# Patient Record
Sex: Male | Born: 1955 | Race: White | Hispanic: No | Marital: Married | Smoking: Never smoker
Health system: Southern US, Community
[De-identification: ages and names within clinical notes are randomized; demographics above are authoritative.]

## PROBLEM LIST (undated history)

## (undated) DIAGNOSIS — E039 Hypothyroidism, unspecified: Secondary | ICD-10-CM

## (undated) DIAGNOSIS — K219 Gastro-esophageal reflux disease without esophagitis: Secondary | ICD-10-CM

## (undated) DIAGNOSIS — F4321 Adjustment disorder with depressed mood: Secondary | ICD-10-CM

## (undated) DIAGNOSIS — J189 Pneumonia, unspecified organism: Secondary | ICD-10-CM

## (undated) DIAGNOSIS — E079 Disorder of thyroid, unspecified: Secondary | ICD-10-CM

## (undated) DIAGNOSIS — J45990 Exercise induced bronchospasm: Secondary | ICD-10-CM

## (undated) DIAGNOSIS — S0990XA Unspecified injury of head, initial encounter: Secondary | ICD-10-CM

## (undated) DIAGNOSIS — E7800 Pure hypercholesterolemia, unspecified: Secondary | ICD-10-CM

## (undated) HISTORY — PX: SHOULDER ARTHROSCOPY: SHX128

## (undated) HISTORY — PX: OTHER SURGICAL HISTORY: SHX169

---
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
## 1998-02-17 ENCOUNTER — Ambulatory Visit (HOSPITAL_COMMUNITY): Admission: RE | Admit: 1998-02-17 | Discharge: 1998-02-17 | Payer: Self-pay | Admitting: Gastroenterology

## 2004-11-07 ENCOUNTER — Ambulatory Visit (HOSPITAL_COMMUNITY): Admission: RE | Admit: 2004-11-07 | Discharge: 2004-11-07 | Payer: Self-pay | Admitting: *Deleted

## 2005-01-10 ENCOUNTER — Ambulatory Visit (HOSPITAL_COMMUNITY): Admission: RE | Admit: 2005-01-10 | Discharge: 2005-01-10 | Payer: Self-pay | Admitting: Orthopedic Surgery

## 2005-09-06 ENCOUNTER — Encounter: Admission: RE | Admit: 2005-09-06 | Discharge: 2005-09-06 | Payer: Self-pay | Admitting: Family Medicine

## 2005-10-31 ENCOUNTER — Ambulatory Visit (HOSPITAL_COMMUNITY): Admission: RE | Admit: 2005-10-31 | Discharge: 2005-10-31 | Payer: Self-pay | Admitting: Neurology

## 2005-12-29 ENCOUNTER — Ambulatory Visit (HOSPITAL_COMMUNITY): Admission: RE | Admit: 2005-12-29 | Discharge: 2005-12-30 | Payer: Self-pay | Admitting: Otolaryngology

## 2005-12-29 ENCOUNTER — Encounter (INDEPENDENT_AMBULATORY_CARE_PROVIDER_SITE_OTHER): Payer: Self-pay | Admitting: *Deleted

## 2006-08-30 ENCOUNTER — Encounter: Admission: RE | Admit: 2006-08-30 | Discharge: 2006-08-30 | Payer: Self-pay | Admitting: Neurology

## 2007-08-08 DIAGNOSIS — F4321 Adjustment disorder with depressed mood: Secondary | ICD-10-CM

## 2007-08-08 HISTORY — DX: Adjustment disorder with depressed mood: F43.21

## 2008-04-01 ENCOUNTER — Encounter: Admission: RE | Admit: 2008-04-01 | Discharge: 2008-04-01 | Payer: Self-pay | Admitting: Emergency Medicine

## 2008-04-14 ENCOUNTER — Encounter: Admission: RE | Admit: 2008-04-14 | Discharge: 2008-04-14 | Payer: Self-pay | Admitting: Family Medicine

## 2008-04-14 ENCOUNTER — Encounter (INDEPENDENT_AMBULATORY_CARE_PROVIDER_SITE_OTHER): Payer: Self-pay | Admitting: Interventional Radiology

## 2008-04-14 ENCOUNTER — Other Ambulatory Visit: Admission: RE | Admit: 2008-04-14 | Discharge: 2008-04-14 | Payer: Self-pay | Admitting: Interventional Radiology

## 2009-05-30 IMAGING — US US BIOPSY
1 series · 10 of 10 positions shown · non-contrast
Comparison: Ultrasound of 04/01/2008

CLINICAL DATA: Isthmus nodule; 3.7x3.9x3.3 cm

ULTRASOUND-GUIDED NEEDLE ASPIRATE BIOPSY, ISTHMUS LOBE OF THYROID
The above procedure was discussed with the patient and written
informed consent was obtained.

[Series 1: us biopsy · 10 acquisitions, 10 frames shown]
[im 1/10]
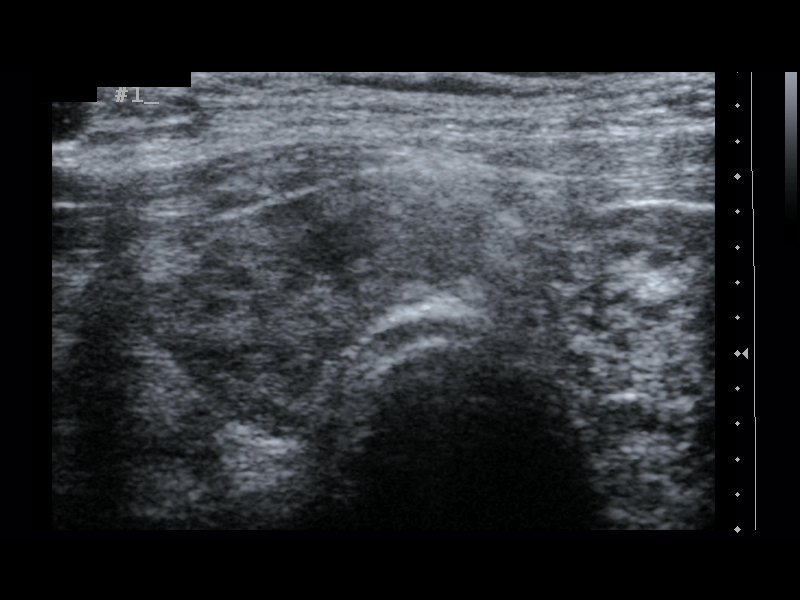
[im 2/10]
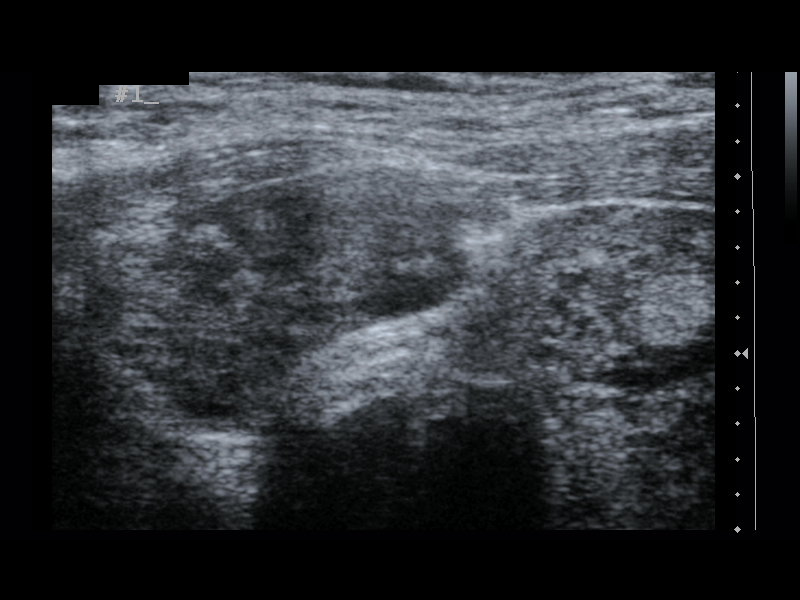
[im 3/10]
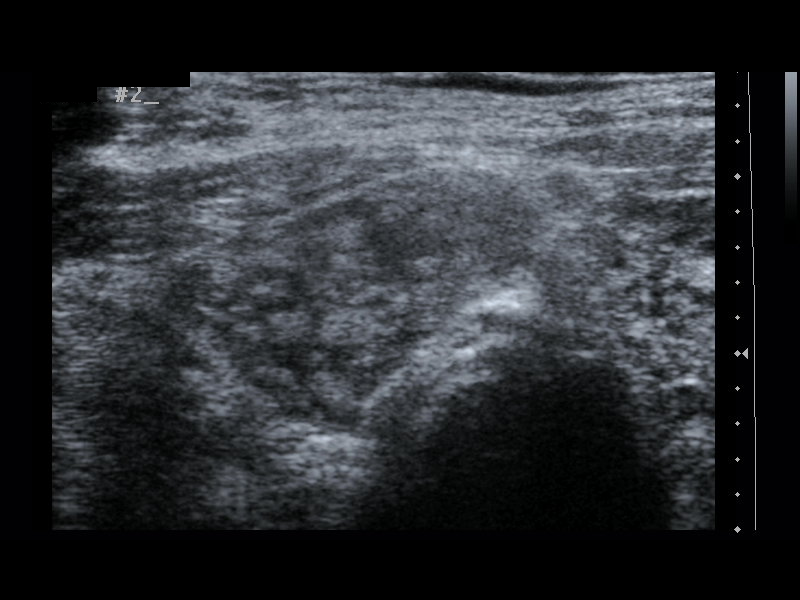
[im 4/10]
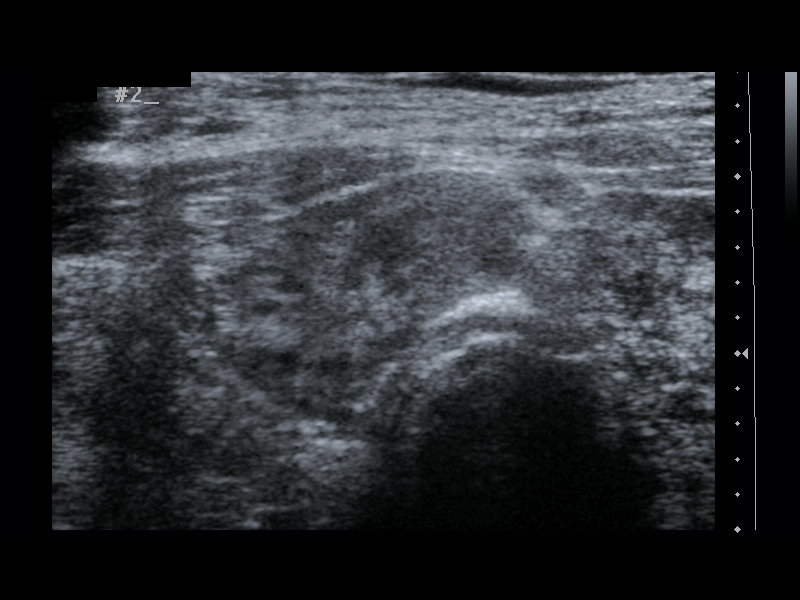
[im 5/10]
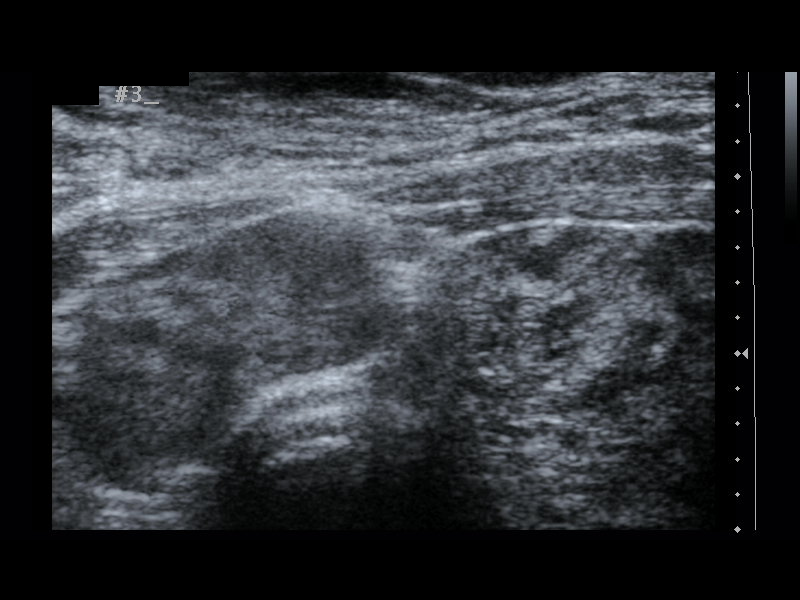
[im 6/10]
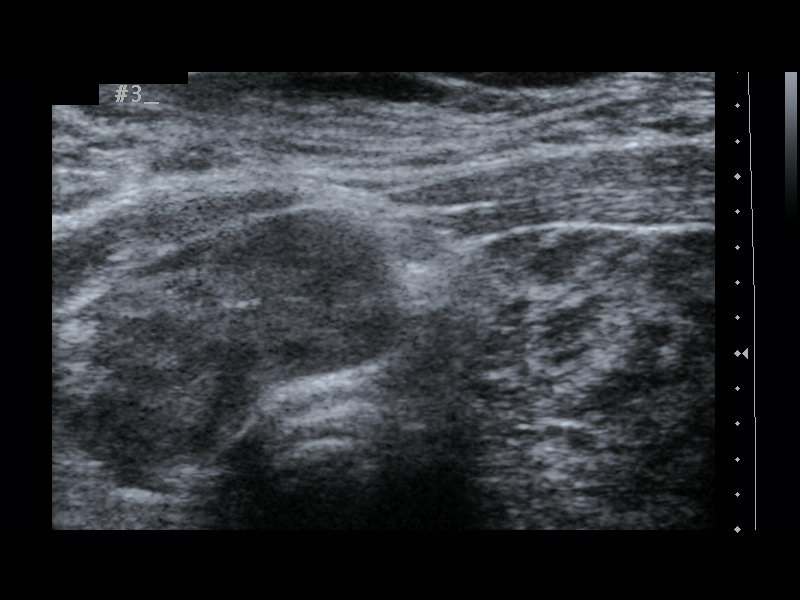
[im 7/10]
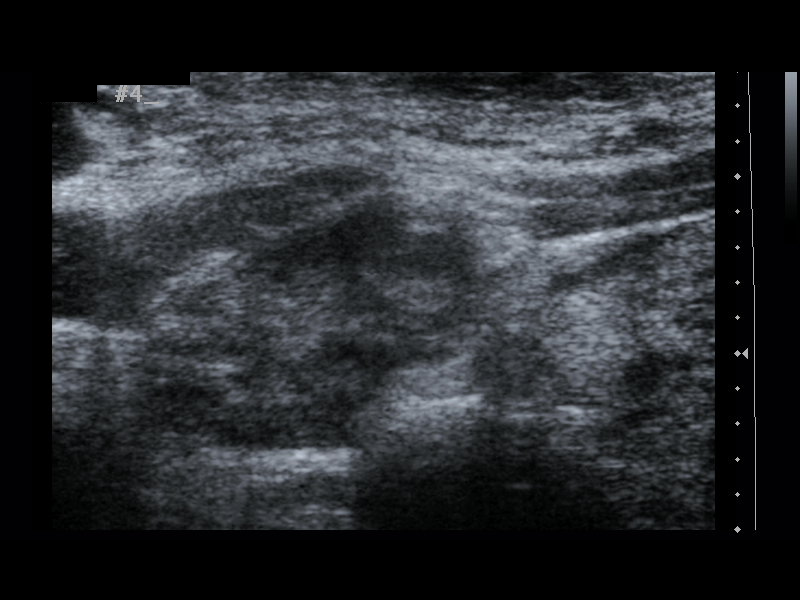
[im 8/10]
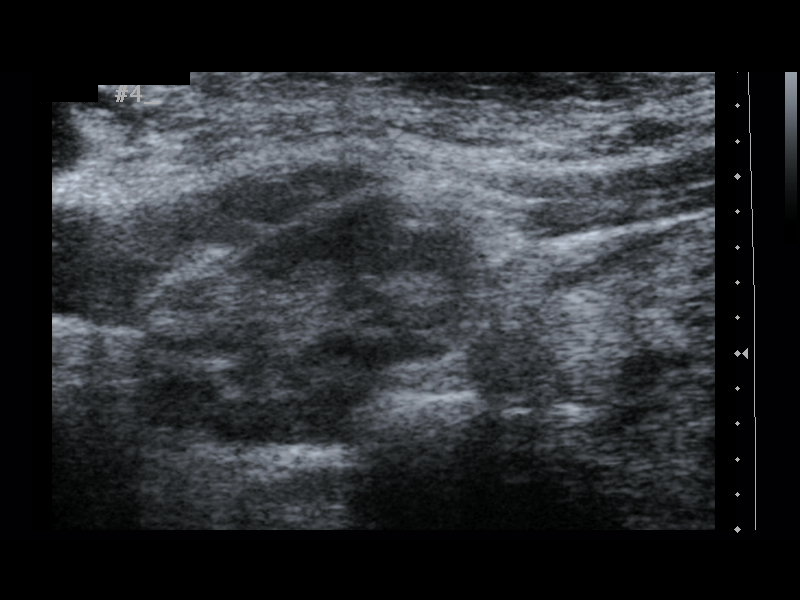
[im 9/10]
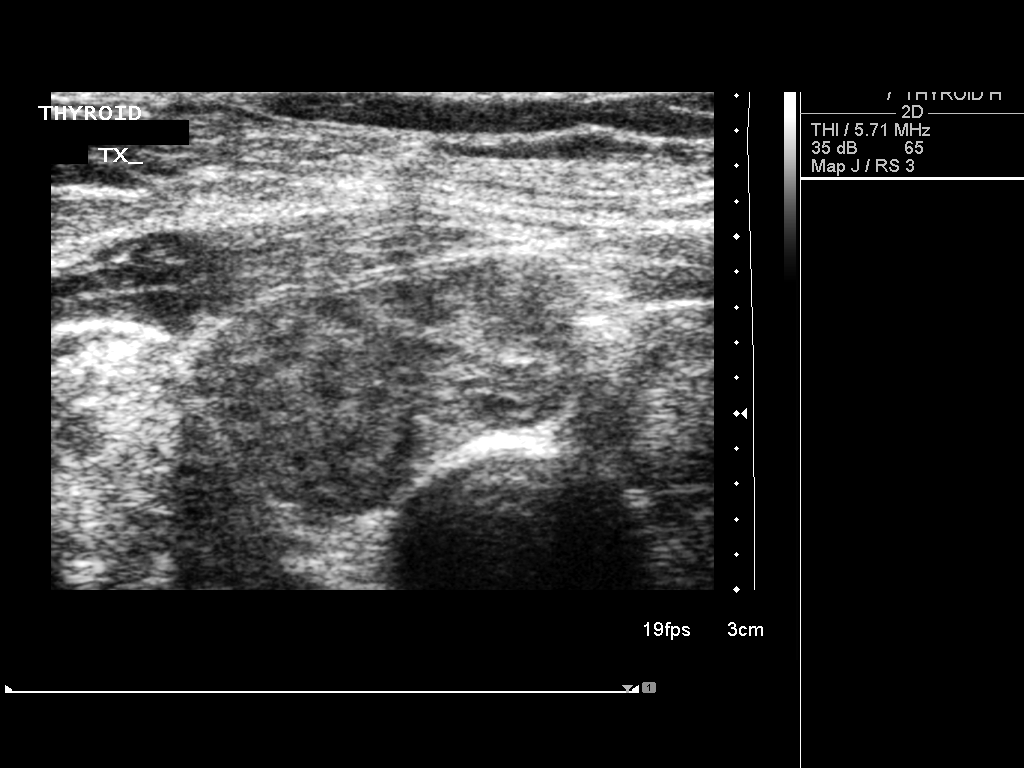
[im 10/10]
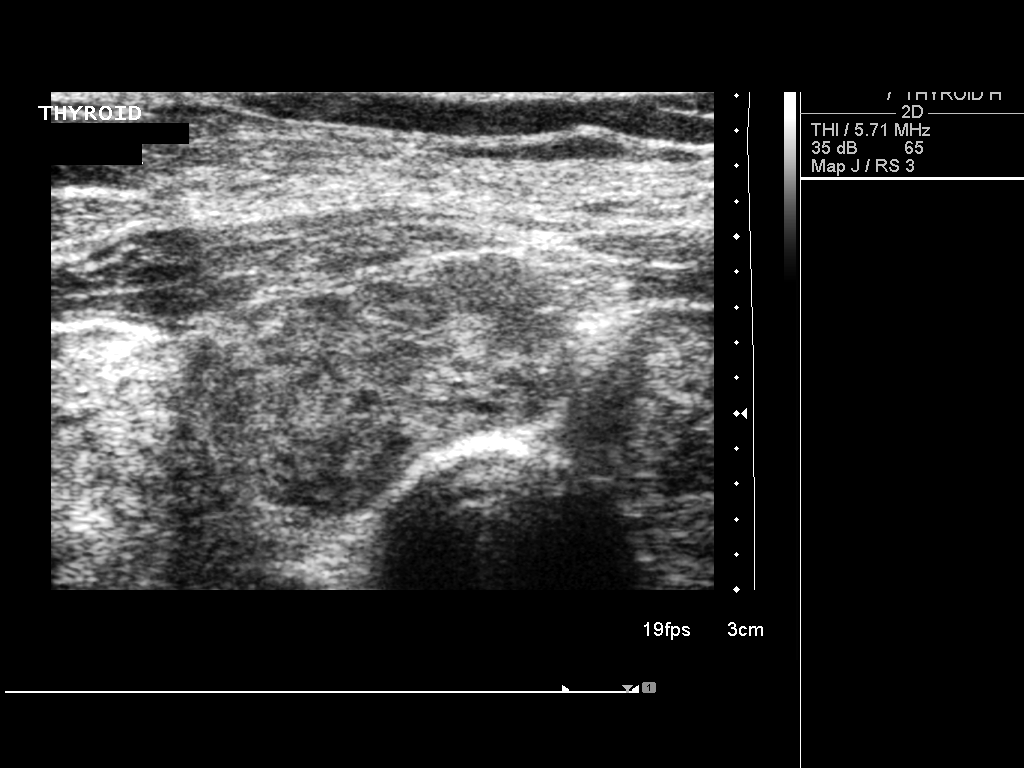

[10 of 10 positions shown; findings below may reference images not displayed]

FINDINGS: Ultrasound was performed to localize and mark an adequate
site for the biopsy.  The patient was then prepped and draped in a
normal sterile fashion.  Local anesthesia was provided with 1%
lidocaine.  Using direct ultrasound guidance, 4 passes were made
using 25 gauge needles into the nodule within the isthmus lobe of
the thyroid.  Ultrasound was used to confirm needle placements on
all occasions.  Specimens were sent to Pathology for analysis.
Post procedural imaging demonstrated no hematoma or immediate
complication.  The patient tolerated the procedure well.
IMPRESSION: Successful ultrasound guided needle aspirate biopsy isthmus lobe of
the thyroid.  Final pathology pending.

 Read by: Abe, Taina.-JOSHJAX

## 2009-10-01 ENCOUNTER — Encounter: Admission: RE | Admit: 2009-10-01 | Discharge: 2009-10-01 | Payer: Self-pay | Admitting: Family Medicine

## 2009-12-22 ENCOUNTER — Encounter: Admission: RE | Admit: 2009-12-22 | Discharge: 2009-12-22 | Payer: Self-pay | Admitting: Internal Medicine

## 2010-04-20 ENCOUNTER — Encounter: Admission: RE | Admit: 2010-04-20 | Discharge: 2010-04-20 | Payer: Self-pay | Admitting: Family Medicine

## 2010-12-23 NOTE — Op Note (Signed)
NAME:  ABHIRAM, CRIADO                ACCOUNT NO.:  [id number]   MEDICAL RECORD NO.:  [id number]          PATIENT TYPE:  AMB   LOCATION:  SDS                          FACILITY:  [facility]   PHYSICIAN:  Marlan Palau, M.D.  DATE OF BIRTH:  [birth year]   DATE OF PROCEDURE:  10/31/2005  DATE OF DISCHARGE:                                 OPERATIVE REPORT   PROCEDURE PERFORMED:  Lumbar puncture.   HISTORY:  This 55 year old patient who is being evaluated for memory  disturbance and abnormal MRI scan of the brain.  The patient is being  evaluated for possible hypercoagulable state versus demyelinating disease.  The patient was brought in for lumbar puncture.  The patient was placed on  the right side in the fetal position.  The low back was cleaned with  Betadine solution.  Approximately 2 mL of 1% Xylocaine was used as local  anesthetic.  A 20 gauge spinal needle was inserted at the L3-4 interspace  and approximately 18 mL of clear, colorless spinal fluid was removed for  testing.  Opening pressure was 200 mmHg.  Again, spinal fluid was clear and  colorless.  Tube #1 was sent for VDRL, cryptococcal antigen, angiotensin  converting enzyme level.  Tube #2 was sent for oligoclonal banding, IgG  albumin ratio.  Tube #3 was sent for cell differential, glucose, protein.  Tube #4 was sent for Lyme's antibody panel.  Blood work was sent for B12,  methylmalonic acid level, sed rate, PT, PTT, antiphospholipid antibody  panel, lupus anticoagulant panel, factor V Leiden, ANA, rheumatoid factor,  PANCA.  The patient tolerated the procedure well.  No complications of above  procedure noted.      Marlan Palau, M.D.  Electronically Signed     CKW/MEDQ  D:  10/31/2005  T:  11/01/2005  Job:  [id number]

## 2010-12-23 NOTE — Op Note (Signed)
NAME:  Derrick Green, Derrick Green                ACCOUNT NO.:  [id number]   MEDICAL RECORD NO.:  [id number]          PATIENT TYPE:  OIB   LOCATION:  5707                         FACILITY:  [facility]   PHYSICIAN:  Antony Contras, MD     DATE OF BIRTH:  [birth year]   DATE OF PROCEDURE:  12/29/2005  DATE OF DISCHARGE:                                 OPERATIVE REPORT   PREOPERATIVE DIAGNOSES:  1.  Right vocal fold paresis.  2.  Hoarseness.   POSTOPERATIVE DIAGNOSES:  1.  Right vocal fold paresis.  2.  Hoarseness.  3.  Left vocal fold cyst.   PROCEDURE:  1.  Suspended microdirect laryngoscopy with bilateral vocal fold injections.  2.  Removal of left vocal fold cyst.   SURGEON:  Christia Reading.   ANESTHESIA:  General, jet Venturi.   COMPLICATIONS:  None.   INDICATIONS:  The patient is a 55 year old white male who has noticed a weak  voice for past year whereby his voice gives out on him after about 20  minutes of speaking.  It has been worsening and is associated with sore  throat that is worse when he speaks more.  Examination shows that the right  vocal fold abducts less than the left side with a fairly good glottal  closure.  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  In order to improve his voice, he presents to the  operating room for surgical management.   FINDINGS:  Upon microdirect laryngoscopy, the vocal folds were slightly  bowed.  There was a small submucosal cyst on the undersurface of the left  vocal fold, and that was removed.  The vocal processes of both vocal folds  were a bit polypoid.   DESCRIPTION OF PROCEDURE:  The patient was identified in the holding room,  and informed consent having been obtained, the patient was moved to the  operative suite and put on the operating table in supine position.  Anesthesia was induced, and the patient was induced under mask  ventilation.  The patient was given 10 mg of Decadron and the bed was turned 90 degrees  from anesthesia after eyes were taped closed.  When under adequate  anesthesia, the mask was removed, and a tooth guard was placed.  A Lindholm  style laryngoscope was then inserted and used to expose the larynx.  The jet  Venturi Ventilation System was then attached to the laryngoscope.  The  patient was then initiated on jet ventilation.  The laryngoscope was placed  in suspension using a Lewy arm on a Mayo stand.  The microscope was brought  into view and used to carefully evaluate the larynx.  After noting the cyst,  the epinephrine soaked pledget was held against the left vocal fold while  the jet Venturi was held.  This was then removed.  A cup forceps was used to  grasp the cyst, and scissors were then used to remove it.  Epinephrine plate  was held again against the vocal fold.  The vocal folds were sprayed prior  to this with left topical lidocaine.  With bleeding controlled, the radius  voice injection was hooked to the transoral needle.  The right vocal fold  was then injected lateral to the vocal fold and just anterior to the vocal  process.  About 0.25 mL was injected in the site.  The left pole was then  injected in a similar position, injecting about 0.15 mL.  Then, 0.05 mL was  then injected in the anterior vocal fold laterally in both sides.  A spatula-  type instrument was used to smooth the vocal fold.  At this point, the the  larynx was suctioned and laryngoscope taken out of suspension and backed out  of the patient's mouth.  The tooth guard was removed, and the patient was  returned to mask ventilation.  He was then returned back to anesthesia for  wake-up and moved to the recovery room in stable condition.      Antony Contras, MD  Electronically Signed     DDB/MEDQ  D:  12/29/2005  T:  12/29/2005  Job:  [id number]

## 2011-03-23 ENCOUNTER — Emergency Department (HOSPITAL_COMMUNITY)
Admission: EM | Admit: 2011-03-23 | Discharge: 2011-03-24 | Disposition: A | Discharge status: Home / Self Care | Payer: BC Managed Care – PPO | Attending: Emergency Medicine | Admitting: Emergency Medicine

## 2011-03-23 DIAGNOSIS — E7800 Pure hypercholesterolemia, unspecified: Secondary | ICD-10-CM | POA: Insufficient documentation

## 2011-03-23 DIAGNOSIS — H5710 Ocular pain, unspecified eye: Secondary | ICD-10-CM | POA: Insufficient documentation

## 2011-03-23 DIAGNOSIS — E039 Hypothyroidism, unspecified: Secondary | ICD-10-CM | POA: Insufficient documentation

## 2011-03-23 DIAGNOSIS — H11429 Conjunctival edema, unspecified eye: Secondary | ICD-10-CM | POA: Insufficient documentation

## 2011-06-28 ENCOUNTER — Other Ambulatory Visit: Payer: Self-pay | Admitting: Internal Medicine

## 2011-06-28 DIAGNOSIS — E042 Nontoxic multinodular goiter: Secondary | ICD-10-CM

## 2011-07-13 ENCOUNTER — Ambulatory Visit
Admission: RE | Admit: 2011-07-13 | Discharge: 2011-07-13 | Disposition: A | Discharge status: Home / Self Care | Payer: BC Managed Care – PPO | Source: Ambulatory Visit | Attending: Internal Medicine | Admitting: Internal Medicine

## 2011-07-13 DIAGNOSIS — E042 Nontoxic multinodular goiter: Secondary | ICD-10-CM

## 2013-06-10 ENCOUNTER — Other Ambulatory Visit: Payer: Self-pay | Admitting: Internal Medicine

## 2013-06-10 DIAGNOSIS — E049 Nontoxic goiter, unspecified: Secondary | ICD-10-CM

## 2013-06-13 ENCOUNTER — Ambulatory Visit
Admission: RE | Admit: 2013-06-13 | Discharge: 2013-06-13 | Disposition: A | Discharge status: Home / Self Care | Payer: BC Managed Care – PPO | Source: Ambulatory Visit | Attending: Internal Medicine | Admitting: Internal Medicine

## 2013-06-13 DIAGNOSIS — E049 Nontoxic goiter, unspecified: Secondary | ICD-10-CM

## 2015-06-28 ENCOUNTER — Other Ambulatory Visit: Payer: Self-pay | Admitting: Internal Medicine

## 2015-06-28 DIAGNOSIS — E042 Nontoxic multinodular goiter: Secondary | ICD-10-CM

## 2015-07-20 ENCOUNTER — Other Ambulatory Visit: Payer: Self-pay | Admitting: Internal Medicine

## 2015-07-20 ENCOUNTER — Ambulatory Visit
Admission: RE | Admit: 2015-07-20 | Discharge: 2015-07-20 | Disposition: A | Discharge status: Home / Self Care | Payer: Self-pay | Source: Ambulatory Visit | Attending: Internal Medicine | Admitting: Internal Medicine

## 2015-07-20 DIAGNOSIS — E042 Nontoxic multinodular goiter: Secondary | ICD-10-CM

## 2015-08-08 HISTORY — PX: COLONOSCOPY W/ BIOPSIES AND POLYPECTOMY: SHX1376

## 2015-09-29 ENCOUNTER — Ambulatory Visit: Payer: Self-pay | Admitting: Surgery

## 2015-11-09 ENCOUNTER — Encounter (HOSPITAL_COMMUNITY): Payer: Self-pay

## 2015-11-09 ENCOUNTER — Encounter (HOSPITAL_COMMUNITY)
Admission: RE | Admit: 2015-11-09 | Discharge: 2015-11-09 | Disposition: A | Discharge status: Home / Self Care | Payer: BLUE CROSS/BLUE SHIELD | Source: Ambulatory Visit | Attending: Surgery | Admitting: Surgery

## 2015-11-09 ENCOUNTER — Other Ambulatory Visit (HOSPITAL_COMMUNITY): Payer: Self-pay | Admitting: *Deleted

## 2015-11-09 ENCOUNTER — Encounter (HOSPITAL_COMMUNITY)
Admission: RE | Admit: 2015-11-09 | Discharge: 2015-11-09 | Disposition: A | Discharge status: Home / Self Care | Payer: BLUE CROSS/BLUE SHIELD | Source: Ambulatory Visit | Attending: Anesthesiology | Admitting: Anesthesiology

## 2015-11-09 DIAGNOSIS — E049 Nontoxic goiter, unspecified: Secondary | ICD-10-CM | POA: Diagnosis not present

## 2015-11-09 DIAGNOSIS — Z01812 Encounter for preprocedural laboratory examination: Secondary | ICD-10-CM | POA: Insufficient documentation

## 2015-11-09 DIAGNOSIS — Z01818 Encounter for other preprocedural examination: Secondary | ICD-10-CM

## 2015-11-09 HISTORY — DX: Unspecified injury of head, initial encounter: S09.90XA

## 2015-11-09 HISTORY — DX: Gastro-esophageal reflux disease without esophagitis: K21.9

## 2015-11-09 HISTORY — DX: Hypothyroidism, unspecified: E03.9

## 2015-11-09 LAB — BASIC METABOLIC PANEL
Anion gap: 6 (ref 5–15)
BUN: 18 mg/dL (ref 6–20)
CHLORIDE: 107 mmol/L (ref 101–111)
CO2: 27 mmol/L (ref 22–32)
CREATININE: 1.19 mg/dL (ref 0.61–1.24)
Calcium: 8.9 mg/dL (ref 8.9–10.3)
GFR calc Af Amer: >60 mL/min (ref >60)
GFR calc non Af Amer: >60 mL/min (ref >60)
Glucose, Bld: 89 mg/dL (ref 65–99)
Potassium: 4.6 mmol/L (ref 3.5–5.1)
Sodium: 140 mmol/L (ref 135–145)

## 2015-11-09 LAB — CBC
HCT: 42.1 % (ref 39.0–52.0)
Hemoglobin: 14.2 g/dL (ref 13.0–17.0)
MCH: 29.3 pg (ref 26.0–34.0)
MCHC: 33.7 g/dL (ref 30.0–36.0)
MCV: 87 fL (ref 78.0–100.0)
PLATELETS: 173 10*3/uL (ref 150–400)
RBC: 4.84 MIL/uL (ref 4.22–5.81)
RDW: 13 % (ref 11.5–15.5)
WBC: 5 10*3/uL (ref 4.0–10.5)

## 2015-11-09 NOTE — Pre-Procedure Instructions (Addendum)
    Derrick Green  11/09/2015      Your procedure is scheduled on Monday, November 15, 2015 at 7:30 AM.  Report to [facility] Entrance "A" Admitting Office at 5:30 AM.  Call this number if you have problems the morning of surgery: [phone]  Any questions prior to day of surgery, please call [phone] between 8 & 4 PM.   Remember:  Do not eat food or drink liquids after midnight Sunday, 11/14/15.  Take these medicines the morning of surgery with A SIP OF WATER: Levothyroxine (Synthroid), eye drops, Loratadine (Claritin) - if needed  Stop Multivitamins as of today. Do not use any Aspirin products prior to surgery.   Do not wear jewelry.  Do not wear lotions, powders, or cologne.  You may wear deodorant.  Men may shave face and neck.  Do not bring valuables to the hospital.  [facility] is not responsible for any belongings or valuables.  Contacts, dentures or bridgework may not be worn into surgery.  Leave your suitcase in the car.  After surgery it may be brought to your room.  For patients admitted to the hospital, discharge time will be determined by your treatment team.  Special instructions:  See "Preparing for Surgery" Instruction sheet.  Please read over the following fact sheets that you were given. Pain Booklet, Coughing and Deep Breathing and Surgical Site Infection Prevention

## 2015-11-09 NOTE — Pre-Procedure Instructions (Signed)
    Derrick Green  11/09/2015    Report to [facility] Entrance "A" Admitting Office at 5:30 AM.               Your surgery or procedure is scheduled for 7:30 AM   Call this number if you have problems the morning of surgery: [phone]  Any questions prior to day of surgery, please call [phone] between 8 & 4 PM.   Remember:  Do not eat food or drink liquids after midnight Sunday, 11/14/15.  Take these medicines the morning of surgery with A SIP OF WATER: Levothyroxine (Synthroid), eye drops, Loratadine (Claritin) - if needed  Stop Multivitamins as of today. Do not use any Aspirin products prior to surgery.   Do not wear jewelry.  Do not wear lotions, powders, or cologne.  You may wear deodorant.  Men may shave face and neck.  Do not bring valuables to the hospital.  [facility] is not responsible for any belongings or valuables.  Contacts, dentures or bridgework may not be worn into surgery.  Leave your suitcase in the car.  After surgery it may be brought to your room.  For patients admitted to the hospital, discharge time will be determined by your treatment team.  Special instructions:  See "Preparing for Surgery" Instruction sheet.  Please read over the following fact sheets that you were given. Pain Booklet, Coughing and Deep Breathing and Surgical Site Infection Prevention

## 2015-11-10 ENCOUNTER — Encounter (HOSPITAL_COMMUNITY): Payer: Self-pay | Admitting: Surgery

## 2015-11-10 DIAGNOSIS — E039 Hypothyroidism, unspecified: Secondary | ICD-10-CM | POA: Diagnosis present

## 2015-11-10 DIAGNOSIS — E042 Nontoxic multinodular goiter: Secondary | ICD-10-CM | POA: Diagnosis present

## 2015-11-10 NOTE — H&P (Signed)
General Surgery [facility]  Shanon Ace DOB: [birth year] Widowed / Language: Cleophus Molt / Race: White Male  History of Present Illness  The patient is a 60 year old male who presents with a thyroid nodule.  Patient is referred by Dr. Delrae Rend for evaluation of multinodular thyroid goiter with mild compressive symptoms. Patient was diagnosed with multinodular thyroid goiter over 7 years ago. He also has hypothyroidism and has been on thyroid hormone replacement since that time. He currently takes Synthroid 125 g daily. There are multiple bilateral nodules with the largest nodule in the thyroid isthmus. Fine needle aspiration biopsy was performed approximately 6 years ago. Reportedly this was benign. Patient has developed mild dysphagia. This is intermittent and occurs every few months. Patient also has had a mild intermittent globus sensation. He has no dyspnea. Patient has had prior vocal cord surgery for hoarseness. He has not had a direct laryngoscopy and some time. He has had intermittent hoarseness. He will probably follow-up with his ENT surgeon in the near future. Patient has a family history of hypothyroidism in the patient's father and sister. Neither of them of had thyroid surgery. Patient denies tremors or palpitations. Ultrasound exam was performed on July 20, 2015. This was compared to prior study in November 2014 and was essentially stable. Right thyroid lobe measured 7.5 cm and left thyroid lobe measured 8.0 cm. Both were heterogeneous and hypervascular with a micronodular pattern. Nodules measured 9 mm or less. Dominant nodule was present in the isthmus measuring up to 21 mm in size.  Other Problems Asthma Gastroesophageal Reflux Disease Hemorrhoids Hypercholesterolemia Thyroid Disease  Past Surgical History Colon Polyp Removal - Colonoscopy Oral Surgery Shoulder Surgery Right. Tonsillectomy  Diagnostic Studies  History Colonoscopy within last year  Allergies PenicillAMINE *ASSORTED CLASSES*  Medication History Simvastatin (20MG  Tablet, Oral) Active. Levothyroxine Sodium (125MCG Tablet, Oral) Active. Medications Reconciled  Social History Alcohol use Occasional alcohol use. Caffeine use Coffee, Tea. No drug use Tobacco use Never smoker.  Family History Alcohol Abuse Mother. Depression Father. Hypertension Father, Mother. Melanoma Father. Migraine Headache Sister. Thyroid problems Father, Sister.  Review of Systems General Not Present- Appetite Loss, Chills, Fatigue, Fever, Night Sweats, Weight Gain and Weight Loss. Skin Not Present- Change in Wart/Mole, Dryness, Hives, Jaundice, New Lesions, Non-Healing Wounds, Rash and Ulcer. HEENT Present- Hoarseness and Seasonal Allergies. Not Present- Earache, Hearing Loss, Nose Bleed, Oral Ulcers, Ringing in the Ears, Sinus Pain, Sore Throat, Visual Disturbances, Wears glasses/contact lenses and Yellow Eyes. Respiratory Not Present- Bloody sputum, Chronic Cough, Difficulty Breathing, Snoring and Wheezing. Breast Not Present- Breast Mass, Breast Pain, Nipple Discharge and Skin Changes. Cardiovascular Not Present- Chest Pain, Difficulty Breathing Lying Down, Leg Cramps, Palpitations, Rapid Heart Rate, Shortness of Breath and Swelling of Extremities. Gastrointestinal Not Present- Abdominal Pain, Bloating, Bloody Stool, Change in Bowel Habits, Chronic diarrhea, Constipation, Difficulty Swallowing, Excessive gas, Gets full quickly at meals, Hemorrhoids, Indigestion, Nausea, Rectal Pain and Vomiting. Male Genitourinary Not Present- Blood in Urine, Change in Urinary Stream, Frequency, Impotence, Nocturia, Painful Urination, Urgency and Urine Leakage. Musculoskeletal Present- Joint Pain. Not Present- Back Pain, Joint Stiffness, Muscle Pain, Muscle Weakness and Swelling of Extremities. Neurological Not Present- Decreased Memory, Fainting,  Headaches, Numbness, Seizures, Tingling, Tremor, Trouble walking and Weakness. Psychiatric Not Present- Anxiety, Bipolar, Change in Sleep Pattern, Depression, Fearful and Frequent crying. Endocrine Not Present- Cold Intolerance, Excessive Hunger, Hair Changes, Heat Intolerance, Hot flashes and New Diabetes. Hematology Not Present- Easy Bruising, Excessive bleeding, Gland problems, HIV and  Persistent Infections.  Vitals Weight: 179 lb Height: 67in Body Surface Area: 1.93 m Body Mass Index: 28.04 kg/m  Temp.: 97.56F(Temporal)  Pulse: 81 (Regular)  BP: 124/80 (Sitting, Left Arm, Standard)  Physical Exam  General - appears comfortable, no distress; not diaphorectic  HEENT - normocephalic; sclerae clear, gaze conjugate; mucous membranes moist, dentition good; voice normal  Neck - symmetric on extension; no palpable anterior or posterior cervical adenopathy; bilateral thyroid lobes are multinodular, soft, nontender; no dominant nodule was palpable in either lobe or in the thyroid isthmus  Chest - clear bilaterally without rhonchi, rales, or wheeze  Cor - regular rhythm with normal rate; no significant murmur  Ext - non-tender without significant edema or lymphedema  Neuro - grossly intact; no tremor  Assessment & Plan  MULTIPLE THYROID NODULES (E04.2) HYPOTHYROIDISM, ADULT (E03.9)  Pt Education - Pamphlet Given - The Thyroid Book: discussed with patient and provided information. Follow-up after evaluation by consultant  Patient presents with multinodular thyroid goiter and hypothyroidism. He has mild intermittent compressive symptoms including dysphagia, globus sensation, and hoarseness. He has had previous vocal cord surgery for treatment of hoarseness. He is currently on Synthroid 125 g daily. He is followed closely by his endocrinologist.  Patient and I discussed thyroid surgery in detail. I provided him with written literature on thyroid surgery. We discussed  surgical options for multinodular thyroid goiter including total thyroidectomy. We discussed risk and benefits of the procedure including the risk of recurrent laryngeal nerve injury and injury to parathyroid glands. We discussed relative indications for surgery. At this point the patient does not absolutely require thyroidectomy, although it is an option in his future management of multinodular goiter.  The risks and benefits of the procedure have been discussed at length with the patient.  The patient understands the proposed procedure, potential alternative treatments, and the course of recovery to be expected.  All of the patient's questions have been answered at this time.  The patient wishes to proceed with surgery.  Earnstine Regal, MD, [facility] Office: [phone]

## 2015-11-15 ENCOUNTER — Encounter (HOSPITAL_COMMUNITY): Payer: Self-pay | Admitting: Certified Registered Nurse Anesthetist

## 2015-11-15 ENCOUNTER — Observation Stay (HOSPITAL_COMMUNITY)
Admission: RE | Admit: 2015-11-15 | Discharge: 2015-11-16 | Disposition: A | Discharge status: Home / Self Care | Payer: BLUE CROSS/BLUE SHIELD | Source: Ambulatory Visit | Attending: Surgery | Admitting: Surgery

## 2015-11-15 ENCOUNTER — Ambulatory Visit (HOSPITAL_COMMUNITY): Payer: BLUE CROSS/BLUE SHIELD | Admitting: Certified Registered Nurse Anesthetist

## 2015-11-15 ENCOUNTER — Encounter (HOSPITAL_COMMUNITY)
Admission: RE | Disposition: A | Discharge status: Home / Self Care | Payer: Self-pay | Source: Ambulatory Visit | Attending: Surgery

## 2015-11-15 DIAGNOSIS — E039 Hypothyroidism, unspecified: Secondary | ICD-10-CM | POA: Diagnosis present

## 2015-11-15 DIAGNOSIS — Z79899 Other long term (current) drug therapy: Secondary | ICD-10-CM | POA: Diagnosis not present

## 2015-11-15 DIAGNOSIS — E063 Autoimmune thyroiditis: Principal | ICD-10-CM | POA: Insufficient documentation

## 2015-11-15 DIAGNOSIS — E042 Nontoxic multinodular goiter: Secondary | ICD-10-CM | POA: Diagnosis present

## 2015-11-15 DIAGNOSIS — E7800 Pure hypercholesterolemia, unspecified: Secondary | ICD-10-CM | POA: Insufficient documentation

## 2015-11-15 DIAGNOSIS — K219 Gastro-esophageal reflux disease without esophagitis: Secondary | ICD-10-CM | POA: Diagnosis not present

## 2015-11-15 DIAGNOSIS — J45909 Unspecified asthma, uncomplicated: Secondary | ICD-10-CM | POA: Diagnosis not present

## 2015-11-15 DIAGNOSIS — E049 Nontoxic goiter, unspecified: Secondary | ICD-10-CM | POA: Diagnosis present

## 2015-11-15 HISTORY — DX: Pure hypercholesterolemia, unspecified: E78.00

## 2015-11-15 HISTORY — DX: Exercise induced bronchospasm: J45.990

## 2015-11-15 HISTORY — DX: Pneumonia, unspecified organism: J18.9

## 2015-11-15 HISTORY — DX: Disorder of thyroid, unspecified: E07.9

## 2015-11-15 HISTORY — DX: Adjustment disorder with depressed mood: F43.21

## 2015-11-15 HISTORY — PX: TOTAL THYROIDECTOMY: SHX2547

## 2015-11-15 HISTORY — PX: THYROIDECTOMY: SHX17

## 2015-11-15 SURGERY — THYROIDECTOMY
Anesthesia: General | Site: Neck

## 2015-11-15 MED ORDER — HEMOSTATIC AGENTS (NO CHARGE) OPTIME
TOPICAL | Status: DC | PRN
  Administered 2015-11-15: 1 via TOPICAL

## 2015-11-15 MED ORDER — LIDOCAINE HCL (CARDIAC) 20 MG/ML IV SOLN
INTRAVENOUS | Status: DC | PRN
  Administered 2015-11-15: 20 mg via INTRAVENOUS

## 2015-11-15 MED ORDER — CALCIUM CARBONATE 1250 (500 CA) MG PO TABS
2.0000 | ORAL_TABLET | Freq: Three times a day (TID) | ORAL | Status: DC
  Administered 2015-11-15 – 2015-11-16 (×3): 1000 mg via ORAL
  Filled 2015-11-15 (×3): qty 1

## 2015-11-15 MED ORDER — 0.9 % SODIUM CHLORIDE (POUR BTL) OPTIME
TOPICAL | Status: DC | PRN
  Administered 2015-11-15: 1000 mL

## 2015-11-15 MED ORDER — OXYCODONE HCL 5 MG PO TABS
5.0000 mg | ORAL_TABLET | ORAL | Status: DC | PRN
  Administered 2015-11-15 – 2015-11-16 (×3): 10 mg via ORAL
  Filled 2015-11-15 (×3): qty 2

## 2015-11-15 MED ORDER — NEOSTIGMINE METHYLSULFATE 10 MG/10ML IV SOLN
INTRAVENOUS | Status: AC
  Filled 2015-11-15: qty 4

## 2015-11-15 MED ORDER — HYDROMORPHONE HCL 1 MG/ML IJ SOLN
INTRAMUSCULAR | Status: AC
  Administered 2015-11-15: 0.5 mg via INTRAVENOUS
  Filled 2015-11-15: qty 1

## 2015-11-15 MED ORDER — ACETAMINOPHEN 325 MG PO TABS: 650.0000 mg | ORAL_TABLET | Freq: Four times a day (QID) | ORAL | Status: DC | PRN

## 2015-11-15 MED ORDER — SUCCINYLCHOLINE CHLORIDE 20 MG/ML IJ SOLN
INTRAMUSCULAR | Status: AC
  Filled 2015-11-15: qty 1

## 2015-11-15 MED ORDER — MEPERIDINE HCL 25 MG/ML IJ SOLN: 6.2500 mg | INTRAMUSCULAR | Status: DC | PRN

## 2015-11-15 MED ORDER — LIDOCAINE HCL (CARDIAC) 20 MG/ML IV SOLN
INTRAVENOUS | Status: AC
  Filled 2015-11-15: qty 5

## 2015-11-15 MED ORDER — LACTATED RINGERS IV SOLN
INTRAVENOUS | Status: DC | PRN
  Administered 2015-11-15: 07:00:00 via INTRAVENOUS

## 2015-11-15 MED ORDER — DEXAMETHASONE SODIUM PHOSPHATE 4 MG/ML IJ SOLN
INTRAMUSCULAR | Status: DC | PRN
Start: 2015-11-15 — End: 2015-11-15
  Administered 2015-11-15: 4 mg via INTRAVENOUS

## 2015-11-15 MED ORDER — SODIUM CHLORIDE 0.9 % IJ SOLN
INTRAMUSCULAR | Status: AC
  Filled 2015-11-15: qty 10

## 2015-11-15 MED ORDER — PHENYLEPHRINE 40 MCG/ML (10ML) SYRINGE FOR IV PUSH (FOR BLOOD PRESSURE SUPPORT)
PREFILLED_SYRINGE | INTRAVENOUS | Status: AC
  Filled 2015-11-15: qty 10

## 2015-11-15 MED ORDER — PROPOFOL 10 MG/ML IV BOLUS
INTRAVENOUS | Status: DC | PRN
  Administered 2015-11-15: 150 mg via INTRAVENOUS
  Administered 2015-11-15: 50 mg via INTRAVENOUS

## 2015-11-15 MED ORDER — SUGAMMADEX SODIUM 200 MG/2ML IV SOLN
INTRAVENOUS | Status: AC
  Filled 2015-11-15: qty 2

## 2015-11-15 MED ORDER — HYDROMORPHONE HCL 1 MG/ML IJ SOLN
1.0000 mg | INTRAMUSCULAR | Status: DC | PRN
  Administered 2015-11-15 (×3): 1 mg via INTRAVENOUS
  Filled 2015-11-15 (×3): qty 1

## 2015-11-15 MED ORDER — ONDANSETRON HCL 4 MG/2ML IJ SOLN
INTRAMUSCULAR | Status: AC
  Filled 2015-11-15: qty 2

## 2015-11-15 MED ORDER — CIPROFLOXACIN IN D5W 400 MG/200ML IV SOLN
400.0000 mg | INTRAVENOUS | Status: AC
  Administered 2015-11-15: 400 mg via INTRAVENOUS
  Filled 2015-11-15: qty 200

## 2015-11-15 MED ORDER — LIDOCAINE HCL 4 % EX SOLN
CUTANEOUS | Status: DC | PRN
  Administered 2015-11-15: 60 mL via TOPICAL

## 2015-11-15 MED ORDER — ONDANSETRON 4 MG PO TBDP: 4.0000 mg | ORAL_TABLET | Freq: Four times a day (QID) | ORAL | Status: DC | PRN

## 2015-11-15 MED ORDER — MIDAZOLAM HCL 2 MG/2ML IJ SOLN
INTRAMUSCULAR | Status: AC
  Filled 2015-11-15: qty 2

## 2015-11-15 MED ORDER — ROCURONIUM BROMIDE 100 MG/10ML IV SOLN
INTRAVENOUS | Status: DC | PRN
  Administered 2015-11-15: 10 mg via INTRAVENOUS
  Administered 2015-11-15: 40 mg via INTRAVENOUS

## 2015-11-15 MED ORDER — ONDANSETRON HCL 4 MG/2ML IJ SOLN: 4.0000 mg | Freq: Four times a day (QID) | INTRAMUSCULAR | Status: DC | PRN

## 2015-11-15 MED ORDER — SUGAMMADEX SODIUM 200 MG/2ML IV SOLN
INTRAVENOUS | Status: DC | PRN
  Administered 2015-11-15: 165.2 mg via INTRAVENOUS

## 2015-11-15 MED ORDER — ROCURONIUM BROMIDE 50 MG/5ML IV SOLN
INTRAVENOUS | Status: AC
  Filled 2015-11-15: qty 1

## 2015-11-15 MED ORDER — MIDAZOLAM HCL 2 MG/2ML IJ SOLN: 0.5000 mg | Freq: Once | INTRAMUSCULAR | Status: DC | PRN

## 2015-11-15 MED ORDER — KCL IN DEXTROSE-NACL 20-5-0.45 MEQ/L-%-% IV SOLN
INTRAVENOUS | Status: DC
  Administered 2015-11-15: 14:00:00 via INTRAVENOUS
  Filled 2015-11-15: qty 1000

## 2015-11-15 MED ORDER — HYDROMORPHONE HCL 1 MG/ML IJ SOLN
0.2500 mg | INTRAMUSCULAR | Status: DC | PRN
  Administered 2015-11-15 (×3): 0.5 mg via INTRAVENOUS

## 2015-11-15 MED ORDER — FENTANYL CITRATE (PF) 100 MCG/2ML IJ SOLN
INTRAMUSCULAR | Status: DC | PRN
  Administered 2015-11-15: 50 ug via INTRAVENOUS
  Administered 2015-11-15: 150 ug via INTRAVENOUS

## 2015-11-15 MED ORDER — PROPOFOL 10 MG/ML IV BOLUS
INTRAVENOUS | Status: AC
  Filled 2015-11-15: qty 40

## 2015-11-15 MED ORDER — ACETAMINOPHEN 650 MG RE SUPP: 650.0000 mg | Freq: Four times a day (QID) | RECTAL | Status: DC | PRN

## 2015-11-15 MED ORDER — ARTIFICIAL TEARS OP OINT
TOPICAL_OINTMENT | OPHTHALMIC | Status: AC
  Filled 2015-11-15: qty 3.5

## 2015-11-15 MED ORDER — FENTANYL CITRATE (PF) 250 MCG/5ML IJ SOLN
INTRAMUSCULAR | Status: AC
  Filled 2015-11-15: qty 5

## 2015-11-15 MED ORDER — ONDANSETRON HCL 4 MG/2ML IJ SOLN
INTRAMUSCULAR | Status: DC | PRN
  Administered 2015-11-15: 4 mg via INTRAVENOUS

## 2015-11-15 MED ORDER — ARTIFICIAL TEARS OP OINT
TOPICAL_OINTMENT | OPHTHALMIC | Status: DC | PRN
  Administered 2015-11-15: 1 via OPHTHALMIC

## 2015-11-15 MED ORDER — HYDROMORPHONE HCL 1 MG/ML IJ SOLN
INTRAMUSCULAR | Status: AC
  Filled 2015-11-15: qty 1

## 2015-11-15 MED ORDER — DORZOLAMIDE HCL-TIMOLOL MAL 2-0.5 % OP SOLN
1.0000 [drp] | Freq: Two times a day (BID) | OPHTHALMIC | Status: DC
  Filled 2015-11-15: qty 10

## 2015-11-15 MED ORDER — BRIMONIDINE TARTRATE 0.2 % OP SOLN
1.0000 [drp] | Freq: Two times a day (BID) | OPHTHALMIC | Status: DC
  Filled 2015-11-15: qty 5

## 2015-11-15 MED ORDER — PROPOFOL 10 MG/ML IV BOLUS
INTRAVENOUS | Status: AC
  Filled 2015-11-15: qty 20

## 2015-11-15 MED ORDER — DEXAMETHASONE SODIUM PHOSPHATE 4 MG/ML IJ SOLN
INTRAMUSCULAR | Status: AC
  Filled 2015-11-15: qty 1

## 2015-11-15 MED ORDER — PROPOFOL 10 MG/ML IV BOLUS
INTRAVENOUS | Status: AC
Start: 2015-11-15 — End: 2015-11-15
  Filled 2015-11-15: qty 20

## 2015-11-15 MED ORDER — LEVOTHYROXINE SODIUM 75 MCG PO TABS
150.0000 ug | ORAL_TABLET | Freq: Every day | ORAL | Status: DC
  Administered 2015-11-16: 150 ug via ORAL
  Filled 2015-11-15: qty 2

## 2015-11-15 MED ORDER — MIDAZOLAM HCL 5 MG/5ML IJ SOLN
INTRAMUSCULAR | Status: DC | PRN
  Administered 2015-11-15: 2 mg via INTRAVENOUS

## 2015-11-15 SURGICAL SUPPLY — 45 items
ATTRACTOMAT 16X20 MAGNETIC DRP (DRAPES) ×2 IMPLANT
BENZOIN TINCTURE PRP APPL 2/3 (GAUZE/BANDAGES/DRESSINGS) ×2 IMPLANT
BLADE SURG 10 STRL SS (BLADE) ×2 IMPLANT
BLADE SURG 15 STRL LF DISP TIS (BLADE) ×1 IMPLANT
BLADE SURG 15 STRL SS (BLADE) ×1
BLADE SURG ROTATE 9660 (MISCELLANEOUS) IMPLANT
CANISTER SUCTION 2500CC (MISCELLANEOUS) ×2 IMPLANT
CHLORAPREP W/TINT 10.5 ML (MISCELLANEOUS) ×2 IMPLANT
CLIP TI MEDIUM 24 (CLIP) ×2 IMPLANT
CLIP TI WIDE RED SMALL 24 (CLIP) ×2 IMPLANT
CONT SPEC 4OZ CLIKSEAL STRL BL (MISCELLANEOUS) IMPLANT
COVER SURGICAL LIGHT HANDLE (MISCELLANEOUS) ×2 IMPLANT
CRADLE DONUT ADULT HEAD (MISCELLANEOUS) ×2 IMPLANT
DRAPE LAPAROTOMY 100X72 PEDS (DRAPES) ×2 IMPLANT
DRAPE UTILITY XL STRL (DRAPES) ×2 IMPLANT
ELECT CAUTERY BLADE 6.4 (BLADE) ×2 IMPLANT
ELECT REM PT RETURN 9FT ADLT (ELECTROSURGICAL) ×2
ELECTRODE REM PT RTRN 9FT ADLT (ELECTROSURGICAL) ×1 IMPLANT
GAUZE SPONGE 4X4 12PLY STRL (GAUZE/BANDAGES/DRESSINGS) ×2 IMPLANT
GAUZE SPONGE 4X4 16PLY XRAY LF (GAUZE/BANDAGES/DRESSINGS) ×4 IMPLANT
GLOVE SURG ORTHO 8.0 STRL STRW (GLOVE) ×2 IMPLANT
GOWN STRL REUS W/ TWL LRG LVL3 (GOWN DISPOSABLE) ×2 IMPLANT
GOWN STRL REUS W/ TWL XL LVL3 (GOWN DISPOSABLE) ×1 IMPLANT
GOWN STRL REUS W/TWL LRG LVL3 (GOWN DISPOSABLE) ×2
GOWN STRL REUS W/TWL XL LVL3 (GOWN DISPOSABLE) ×1
HEMOSTAT SURGICEL 2X4 FIBR (HEMOSTASIS) ×2 IMPLANT
KIT BASIN OR (CUSTOM PROCEDURE TRAY) ×2 IMPLANT
KIT ROOM TURNOVER OR (KITS) ×2 IMPLANT
LIGHT WAVEGUIDE WIDE FLAT (MISCELLANEOUS) ×2 IMPLANT
NS IRRIG 1000ML POUR BTL (IV SOLUTION) ×2 IMPLANT
PACK SURGICAL SETUP 50X90 (CUSTOM PROCEDURE TRAY) ×2 IMPLANT
PAD ARMBOARD 7.5X6 YLW CONV (MISCELLANEOUS) ×2 IMPLANT
PENCIL BUTTON HOLSTER BLD 10FT (ELECTRODE) ×2 IMPLANT
SHEARS HARMONIC 9CM CVD (BLADE) ×2 IMPLANT
SPECIMEN JAR MEDIUM (MISCELLANEOUS) IMPLANT
SPONGE INTESTINAL PEANUT (DISPOSABLE) ×2 IMPLANT
STRIP CLOSURE SKIN 1/2X4 (GAUZE/BANDAGES/DRESSINGS) ×2 IMPLANT
SUT MNCRL AB 4-0 PS2 18 (SUTURE) ×2 IMPLANT
SUT SILK 2 0 (SUTURE) ×1
SUT SILK 2-0 18XBRD TIE 12 (SUTURE) ×1 IMPLANT
SUT VIC AB 3-0 SH 18 (SUTURE) ×2 IMPLANT
SYR BULB 3OZ (MISCELLANEOUS) ×2 IMPLANT
TOWEL OR 17X24 6PK STRL BLUE (TOWEL DISPOSABLE) ×2 IMPLANT
TOWEL OR 17X26 10 PK STRL BLUE (TOWEL DISPOSABLE) ×2 IMPLANT
TUBE CONNECTING 12X1/4 (SUCTIONS) ×2 IMPLANT

## 2015-11-15 NOTE — Anesthesia Procedure Notes (Addendum)
Procedure Name: Intubation Date/Time: 11/15/2015 7:32 AM Performed by: Garrison Columbus T Pre-anesthesia Checklist: Patient identified, Emergency Drugs available, Suction available and Patient being monitored Patient Re-evaluated:Patient Re-evaluated prior to inductionOxygen Delivery Method: Circle system utilized Preoxygenation: Pre-oxygenation with 100% oxygen Intubation Type: IV induction Ventilation: Mask ventilation without difficulty Laryngoscope Size: 2 and Miller Grade View: Grade I Tube type: Oral Tube size: 7.5 mm Number of attempts: 1 Airway Equipment and Method: Stylet and LTA kit utilized Placement Confirmation: ETT inserted through vocal cords under direct vision,  positive ETCO2 and breath sounds checked- equal and bilateral Secured at: 22 cm Tube secured with: Tape Dental Injury: Teeth and Oropharynx as per pre-operative assessment  Comments: Intubation by Caprice Red, SRNA.

## 2015-11-15 NOTE — Op Note (Signed)
Procedure Note  Pre-operative Diagnosis:  Multinodular thyroid goiter with compressive symptoms  Post-operative Diagnosis:  same  Surgeon:  Earnstine Regal, MD, FACS   Procedure:  Total thyroidectomy  Anesthesia:  General  Estimated Blood Loss:  minimal  Drains: none         Specimen: thyroid to pathology  Indications:  Patient is referred by Dr. Delrae Rend for evaluation of multinodular thyroid goiter with mild compressive symptoms. Patient was diagnosed with multinodular thyroid goiter over 7 years ago. He also has hypothyroidism and has been on thyroid hormone replacement since that time. He currently takes Synthroid 125 g daily. There are multiple bilateral nodules with the largest nodule in the thyroid isthmus. Fine needle aspiration biopsy was performed approximately 6 years ago. Reportedly this was benign. Patient has developed mild dysphagia. This is intermittent and occurs every few months. Patient also has had a mild intermittent globus sensation. He has no dyspnea. Patient has had prior vocal cord surgery for hoarseness. He has had intermittent hoarseness. Ultrasound exam was performed on July 20, 2015. Right thyroid lobe measured 7.5 cm and left thyroid lobe measured 8.0 cm. Both were heterogeneous and hypervascular with a micronodular pattern. Nodules measured 9 mm or less. Dominant nodule was present in the isthmus measuring up to 21 mm in size.  Procedure Details: Procedure was done in OR #2 at the [facility].  The patient was brought to the operating room and placed in a supine position on the operating room table.  Following administration of general anesthesia, the patient was positioned and then prepped and draped in the usual aseptic fashion.  After ascertaining that an adequate level of anesthesia had been achieved, a Kocher incision was made with #15 blade.  Dissection was carried through subcutaneous tissues and platysma. Hemostasis was  achieved with the electrocautery.  Skin flaps were elevated cephalad and caudad from the thyroid notch to the sternal notch.  The Mahorner self-retaining retractor was placed for exposure.  Strap muscles were incised in the midline and dissection was begun on the left side.  Strap muscles were reflected laterally.  Left thyroid lobe was markedly enlarged and firm.  The left lobe was gently mobilized with blunt dissection.  Superior pole vessels were dissected out and divided individually between small and medium Ligaclips with the Harmonic scalpel.  The thyroid lobe was rolled anteriorly.  Branches of the inferior thyroid artery were divided between small Ligaclips with the Harmonic scalpel.  Inferior venous tributaries were divided between Ligaclips.  Both the superior and inferior parathyroid glands were identified and preserved on their vascular pedicles.  The recurrent laryngeal nerve was identified and preserved along its course.  The ligament of Gwenlyn Found was released with the electrocautery and the gland was mobilized onto the anterior trachea. Isthmus was mobilized across the midline.  There was a small pyramidal lobe present which was dissected off of the thyroid cartilage and resected.  Dry pack was placed in the left neck.  Next, the right thyroid lobe was gently mobilized with blunt dissection.  Right thyroid lobe was enlarged and multinodular and firm.  Superior pole vessels were dissected out and divided between small and medium Ligaclips with the Harmonic scalpel.  Superior parathyroid was identified and preserved.  Inferior venous tributaries were divided between medium Ligaclips with the Harmonic scalpel.  The right thyroid lobe was rolled anteriorly and the branches of the inferior thyroid artery divided between small Ligaclips.  The right recurrent laryngeal nerve was identified and  preserved along its course.  The ligament of Gwenlyn Found was released with the electrocautery.  The right thyroid lobe was  mobilized onto the anterior trachea and the remainder of the thyroid was dissected off the anterior trachea and the thyroid was completely excised.  A suture was used to mark the left lobe. The entire thyroid gland was submitted to pathology for review.  The neck was irrigated with warm saline.  Fibrillar was placed throughout the operative field.  Strap muscles were reapproximated in the midline with interrupted 3-0 Vicryl sutures.  Platysma was closed with interrupted 3-0 Vicryl sutures.  Skin was closed with a running 4-0 Monocryl subcuticular suture.  Wound was washed and dried and benzoin and steri-strips were applied.  Dry gauze dressing was placed.  The patient was awakened from anesthesia and brought to the recovery room.  The patient tolerated the procedure well.   Earnstine Regal, MD, [facility] Office: [phone]

## 2015-11-15 NOTE — Anesthesia Preprocedure Evaluation (Addendum)
Anesthesia Evaluation  Patient identified by MRN, date of birth, ID band Patient awake    Reviewed: Allergy & Precautions, NPO status , Patient's Chart, lab work & pertinent test results  History of Anesthesia Complications Negative for: history of anesthetic complications  Airway Mallampati: II  TM Distance: >3 FB Neck ROM: Full    Dental  (+) Dental Advisory Given, Teeth Intact   Pulmonary asthma (hasn't needed inhaler in over a year) ,    breath sounds clear to auscultation       Cardiovascular (-) anginanegative cardio ROS   Rhythm:Regular Rate:Normal     Neuro/Psych negative neurological ROS     GI/Hepatic Neg liver ROS, GERD  Medicated and Controlled,  Endo/Other  Hypothyroidism   Renal/GU negative Renal ROS     Musculoskeletal   Abdominal   Peds  Hematology negative hematology ROS (+)   Anesthesia Other Findings   Reproductive/Obstetrics                           Anesthesia Physical Anesthesia Plan  ASA: II  Anesthesia Plan: General   Post-op Pain Management:    Induction: Intravenous  Airway Management Planned: Oral ETT  Additional Equipment:   Intra-op Plan:   Post-operative Plan: Extubation in OR  Informed Consent: I have reviewed the patients History and Physical, chart, labs and discussed the procedure including the risks, benefits and alternatives for the proposed anesthesia with the patient or authorized representative who has indicated his/her understanding and acceptance.   Dental advisory given  Plan Discussed with: CRNA, Anesthesiologist and Surgeon  Anesthesia Plan Comments: (Plan routine monitors, GETA)       Anesthesia Quick Evaluation

## 2015-11-15 NOTE — Progress Notes (Signed)
Dr. Jenita Seashore at bedside. Updated and discussed postop . Cleared to transfer to 6 N

## 2015-11-15 NOTE — Interval H&P Note (Signed)
History and Physical Interval Note:  11/15/2015 7:03 AM  Derrick Green  has presented today for surgery, with the diagnosis of multinodular thyroid goiter  The various methods of treatment have been discussed with the patient and family. After consideration of risks, benefits and other options for treatment, the patient has consented to    Procedure(s): TOTAL THYROIDECTOMY (N/A) as a surgical intervention .    The patient's history has been reviewed, patient examined, no change in status, stable for surgery.  I have reviewed the patient's chart and labs.  Questions were answered to the patient's satisfaction.    Earnstine Regal, MD, [facility] Office: [place]

## 2015-11-15 NOTE — Anesthesia Postprocedure Evaluation (Signed)
Anesthesia Post Note  Patient: Derrick Green  Procedure(s) Performed: Procedure(s) (LRB): TOTAL THYROIDECTOMY (N/A)  Patient location during evaluation: PACU Anesthesia Type: General Level of consciousness: awake and alert, patient cooperative and oriented Pain management: pain level controlled Vital Signs Assessment: post-procedure vital signs reviewed and stable Respiratory status: spontaneous breathing, nonlabored ventilation, respiratory function stable and patient connected to nasal cannula oxygen Cardiovascular status: blood pressure returned to baseline and stable Postop Assessment: no signs of nausea or vomiting Anesthetic complications: no    Last Vitals:  Filed Vitals:   11/15/15 1030 11/15/15 1045  BP: 181/100 186/100  Pulse: 59 58  Temp: 36.4 C   Resp: 13 10    Last Pain:  Filed Vitals:   11/15/15 1057  PainSc: 4                  Mahlia Fernando,E. Dondrea Clendenin

## 2015-11-15 NOTE — Transfer of Care (Signed)
Immediate Anesthesia Transfer of Care Note  Patient: Derrick Green  Procedure(s) Performed: Procedure(s): TOTAL THYROIDECTOMY (N/A)  Patient Location: PACU  Anesthesia Type:General  Level of Consciousness: awake, alert  and oriented  Airway & Oxygen Therapy: Patient Spontanous Breathing and Patient connected to face mask oxygen  Post-op Assessment: Report given to RN, Post -op Vital signs reviewed and stable and Patient moving all extremities X 4  Post vital signs: Reviewed and stable  Last Vitals:  Filed Vitals:   11/15/15 0622  BP: 169/91  Pulse: 56  Temp: 36.7 C  Resp: 20    Complications: No apparent anesthesia complications

## 2015-11-16 ENCOUNTER — Encounter (HOSPITAL_COMMUNITY): Payer: Self-pay | Admitting: Surgery

## 2015-11-16 DIAGNOSIS — E063 Autoimmune thyroiditis: Secondary | ICD-10-CM | POA: Diagnosis not present

## 2015-11-16 LAB — BASIC METABOLIC PANEL
ANION GAP: 10 (ref 5–15)
BUN: 14 mg/dL (ref 6–20)
CALCIUM: 9.1 mg/dL (ref 8.9–10.3)
CO2: 28 mmol/L (ref 22–32)
Chloride: 98 mmol/L — ABNORMAL LOW (ref 101–111)
Creatinine, Ser: 1.27 mg/dL — ABNORMAL HIGH (ref 0.61–1.24)
GFR calc non Af Amer: 59 mL/min — ABNORMAL LOW (ref >60)
Glucose, Bld: 115 mg/dL — ABNORMAL HIGH (ref 65–99)
Potassium: 3.9 mmol/L (ref 3.5–5.1)
SODIUM: 136 mmol/L (ref 135–145)

## 2015-11-16 MED ORDER — CALCIUM CARBONATE 1250 (500 CA) MG PO TABS: 2.0000 | ORAL_TABLET | Freq: Two times a day (BID) | ORAL | Status: DC

## 2015-11-16 MED ORDER — HYDROMORPHONE HCL 2 MG PO TABS: 2.0000 mg | ORAL_TABLET | ORAL | Status: DC | PRN

## 2015-11-16 NOTE — Discharge Summary (Signed)
Physician Discharge Summary [facility]  Patient ID: Derrick Green MRN: [id number] DOB/AGE: [birth year] 62 y.o.  Admit date: 11/15/2015 Discharge date: 11/16/2015  Admission Diagnoses:  Multinodular thyroid goiter  Discharge Diagnoses:  Principal Problem:   Multiple thyroid nodules Active Problems:   Hypothyroidism   Thyroid goiter   Discharged Condition: good  Hospital Course: Patient was admitted for observation following thyroid surgery.  Post op course was uncomplicated.  Pain was well controlled.  Tolerated diet.  Post op calcium level on morning following surgery was 9.1 mg/dl.  Patient was prepared for discharge home on POD#1.  Consults: None  Treatments: surgery: total thyroidectomy  Discharge Exam: Blood pressure 156/89, pulse 62, temperature 98.1 F (36.7 C), temperature source Oral, resp. rate 16, height 5\' 7"  (1.702 m), weight 82.555 kg (182 lb), SpO2 97 %. HEENT - clear Neck - wound dry and intact, mild STS, voice normal Chest - clear bilaterally Cor - RRR  Disposition: Home  Discharge Instructions    Apply dressing    Complete by:  As directed   Apply light gauze dressing to wound before discharge home today.     Diet - low sodium heart healthy    Complete by:  As directed      Discharge instructions    Complete by:  As directed   [facility]  THYROID & PARATHYROID SURGERY:  POST-OP INSTRUCTIONS  Always review your discharge instruction sheet from the facility where your surgery was performed.  A prescription for pain medication may be given to you upon discharge.  Take your pain medication as prescribed.  If narcotic pain medicine is not needed, then you may take acetaminophen (Tylenol) or ibuprofen (Advil) as needed.  Take your usually prescribed medications unless otherwise directed.  If you need a refill on your pain medication, please contact your pharmacy. They will contact our office to request  authorization.  Prescriptions will not be processed by our office after 5 pm or on weekends.  Start with a light diet upon arrival home, such as soup and crackers or toast.  Be sure to drink plenty of fluids daily.  Resume your normal diet the day after surgery.  Most patients will experience some swelling and bruising on the chest and neck area.  Ice packs will help.  Swelling and bruising can take several days to resolve.   It is common to experience some constipation after surgery.  Increasing fluid intake and taking a stool softener will usually help or prevent this problem.  A mild laxative (Milk of Magnesia or Miralax) should be taken according to package directions if there has been no bowel movement after 48 hours.  You have steri-strips and a gauze dressing over your incision.  You may remove the gauze bandage on the second day after surgery, and you may shower at that time.  Leave your steri-strips (small skin tapes) in place directly over the incision.  These strips should remain on the skin for 5-7 days and then be removed.  You may get them wet in the shower and pat them dry.  You may resume regular (light) daily activities beginning the next day - such as daily self-care, walking, climbing stairs - gradually increasing activities as tolerated.  You may have sexual intercourse when it is comfortable.  Refrain from any heavy lifting or straining until approved by your doctor.  You may drive when you no longer are taking prescription pain medication, you can comfortably wear a  seatbelt, and you can safely maneuver your car and apply brakes.  You should see your doctor in the office for a follow-up appointment approximately two to three weeks after your surgery.  Make sure that you call for this appointment within a day or two after you arrive home to insure a convenient appointment time.  WHEN TO CALL YOUR DOCTOR: -- Fever greater than 101.5 -- Inability to urinate -- Nausea and/or  vomiting - persistent -- Extreme swelling or bruising -- Continued bleeding from incision -- Increased pain, redness, or drainage from the incision -- Difficulty swallowing or breathing -- Muscle cramping or spasms -- Numbness or tingling in hands or around lips  The clinic staff is available to answer your questions during regular business hours.  Please don't hesitate to call and ask to speak to one of the nurses if you have concerns.  Earnstine Regal, MD, [facility] Office: [phone]  Website: [email or web address]     Increase activity slowly    Complete by:  As directed      Remove dressing in 24 hours    Complete by:  As directed             Medication List    TAKE these medications        brimonidine 0.2 % ophthalmic solution  Commonly known as:  ALPHAGAN  Place 1 drop into both eyes 2 (two) times daily.     calcium carbonate 1250 (500 Ca) MG tablet  Commonly known as:  OS-CAL - dosed in mg of elemental calcium  Take 2 tablets (1,000 mg of elemental calcium total) by mouth 2 (two) times daily with a meal.     dorzolamide-timolol 22.3-6.8 MG/ML ophthalmic solution  Commonly known as:  COSOPT  Place 1 drop into both eyes 2 (two) times daily.     HYDROmorphone 2 MG tablet  Commonly known as:  DILAUDID  Take 1-2 tablets (2-4 mg total) by mouth every 4 (four) hours as needed for moderate pain.     levothyroxine 150 MCG tablet  Commonly known as:  SYNTHROID, LEVOTHROID  Take 150 mcg by mouth daily before breakfast.     loratadine 10 MG tablet  Commonly known as:  CLARITIN  Take 10 mg by mouth daily as needed for allergies.     ONE-A-DAY MENS PO  Take 1 tablet by mouth daily.     simvastatin 20 MG tablet  Commonly known as:  ZOCOR  Take 20 mg by mouth daily.           Follow-up Information    Follow up with Earnstine Regal, MD. Schedule an appointment as soon as possible for a visit in 3  weeks.   Specialty:  General Surgery   Why:  For wound re-check   Contact information:   [place] [address] [phone]       Earnstine Regal, MD, [facility] Office: [phone]   Signed: Earnstine Regal 11/16/2015, 7:46 AM

## 2015-11-16 NOTE — Progress Notes (Signed)
Discussed discharge summary with patient. Reviewed all medications with patient. Patient received Rx. Patient ready for discharge. 

## 2015-11-17 NOTE — Progress Notes (Signed)
Quick Note:  Please contact patient and notify of benign pathology results.  Derrick Green M. Derrick Michie, MD, FACS [facility] Office: [phone]   ______ 

## 2016-08-18 DIAGNOSIS — I10 Essential (primary) hypertension: Secondary | ICD-10-CM | POA: Diagnosis not present

## 2016-08-31 DIAGNOSIS — E7800 Pure hypercholesterolemia, unspecified: Secondary | ICD-10-CM | POA: Diagnosis not present

## 2016-08-31 DIAGNOSIS — J45909 Unspecified asthma, uncomplicated: Secondary | ICD-10-CM | POA: Diagnosis not present

## 2016-08-31 DIAGNOSIS — E039 Hypothyroidism, unspecified: Secondary | ICD-10-CM | POA: Diagnosis not present

## 2016-10-03 DIAGNOSIS — E7800 Pure hypercholesterolemia, unspecified: Secondary | ICD-10-CM | POA: Diagnosis not present

## 2017-06-08 DIAGNOSIS — D225 Melanocytic nevi of trunk: Secondary | ICD-10-CM | POA: Diagnosis not present

## 2017-06-08 DIAGNOSIS — D2262 Melanocytic nevi of left upper limb, including shoulder: Secondary | ICD-10-CM | POA: Diagnosis not present

## 2017-06-08 DIAGNOSIS — L821 Other seborrheic keratosis: Secondary | ICD-10-CM | POA: Diagnosis not present

## 2017-06-08 DIAGNOSIS — L570 Actinic keratosis: Secondary | ICD-10-CM | POA: Diagnosis not present

## 2017-07-06 DIAGNOSIS — E7800 Pure hypercholesterolemia, unspecified: Secondary | ICD-10-CM | POA: Diagnosis not present

## 2017-07-06 DIAGNOSIS — Z125 Encounter for screening for malignant neoplasm of prostate: Secondary | ICD-10-CM | POA: Diagnosis not present

## 2017-07-06 DIAGNOSIS — E039 Hypothyroidism, unspecified: Secondary | ICD-10-CM | POA: Diagnosis not present

## 2017-07-06 DIAGNOSIS — Z0000 Encounter for general adult medical examination without abnormal findings: Secondary | ICD-10-CM | POA: Diagnosis not present

## 2017-07-06 DIAGNOSIS — Z23 Encounter for immunization: Secondary | ICD-10-CM | POA: Diagnosis not present

## 2017-09-14 DIAGNOSIS — H401131 Primary open-angle glaucoma, bilateral, mild stage: Secondary | ICD-10-CM | POA: Diagnosis not present

## 2017-12-14 DIAGNOSIS — H401131 Primary open-angle glaucoma, bilateral, mild stage: Secondary | ICD-10-CM | POA: Diagnosis not present

## 2018-01-11 DIAGNOSIS — S70361A Insect bite (nonvenomous), right thigh, initial encounter: Secondary | ICD-10-CM | POA: Diagnosis not present

## 2018-01-11 DIAGNOSIS — S30861A Insect bite (nonvenomous) of abdominal wall, initial encounter: Secondary | ICD-10-CM | POA: Diagnosis not present

## 2018-01-14 DIAGNOSIS — H401122 Primary open-angle glaucoma, left eye, moderate stage: Secondary | ICD-10-CM | POA: Diagnosis not present

## 2018-03-08 DIAGNOSIS — I10 Essential (primary) hypertension: Secondary | ICD-10-CM | POA: Diagnosis not present

## 2018-03-08 DIAGNOSIS — K219 Gastro-esophageal reflux disease without esophagitis: Secondary | ICD-10-CM | POA: Diagnosis not present

## 2018-03-29 DIAGNOSIS — I10 Essential (primary) hypertension: Secondary | ICD-10-CM | POA: Diagnosis not present

## 2018-04-25 DIAGNOSIS — Z7689 Persons encountering health services in other specified circumstances: Secondary | ICD-10-CM | POA: Diagnosis not present

## 2018-04-25 DIAGNOSIS — T148XXA Other injury of unspecified body region, initial encounter: Secondary | ICD-10-CM | POA: Diagnosis not present

## 2018-04-25 DIAGNOSIS — W57XXXA Bitten or stung by nonvenomous insect and other nonvenomous arthropods, initial encounter: Secondary | ICD-10-CM | POA: Diagnosis not present

## 2018-04-26 DIAGNOSIS — Z23 Encounter for immunization: Secondary | ICD-10-CM | POA: Diagnosis not present

## 2018-06-10 DIAGNOSIS — D0461 Carcinoma in situ of skin of right upper limb, including shoulder: Secondary | ICD-10-CM | POA: Diagnosis not present

## 2018-06-10 DIAGNOSIS — C44712 Basal cell carcinoma of skin of right lower limb, including hip: Secondary | ICD-10-CM | POA: Diagnosis not present

## 2018-07-16 DIAGNOSIS — I10 Essential (primary) hypertension: Secondary | ICD-10-CM | POA: Diagnosis not present

## 2018-07-16 DIAGNOSIS — Z0000 Encounter for general adult medical examination without abnormal findings: Secondary | ICD-10-CM | POA: Diagnosis not present

## 2018-07-16 DIAGNOSIS — E7800 Pure hypercholesterolemia, unspecified: Secondary | ICD-10-CM | POA: Diagnosis not present

## 2018-07-16 DIAGNOSIS — E039 Hypothyroidism, unspecified: Secondary | ICD-10-CM | POA: Diagnosis not present

## 2018-09-20 DIAGNOSIS — H401131 Primary open-angle glaucoma, bilateral, mild stage: Secondary | ICD-10-CM | POA: Diagnosis not present

## 2018-10-18 DIAGNOSIS — Z86010 Personal history of colonic polyps: Secondary | ICD-10-CM | POA: Diagnosis not present

## 2018-10-18 DIAGNOSIS — K5730 Diverticulosis of large intestine without perforation or abscess without bleeding: Secondary | ICD-10-CM | POA: Diagnosis not present

## 2018-10-18 DIAGNOSIS — K635 Polyp of colon: Secondary | ICD-10-CM | POA: Diagnosis not present

## 2018-10-18 DIAGNOSIS — K640 First degree hemorrhoids: Secondary | ICD-10-CM | POA: Diagnosis not present

## 2019-01-17 DIAGNOSIS — H401122 Primary open-angle glaucoma, left eye, moderate stage: Secondary | ICD-10-CM | POA: Diagnosis not present

## 2019-05-12 DIAGNOSIS — Z23 Encounter for immunization: Secondary | ICD-10-CM | POA: Diagnosis not present

## 2019-05-20 DIAGNOSIS — H401122 Primary open-angle glaucoma, left eye, moderate stage: Secondary | ICD-10-CM | POA: Diagnosis not present

## 2019-06-16 DIAGNOSIS — Z125 Encounter for screening for malignant neoplasm of prostate: Secondary | ICD-10-CM | POA: Diagnosis not present

## 2019-06-16 DIAGNOSIS — Z23 Encounter for immunization: Secondary | ICD-10-CM | POA: Diagnosis not present

## 2019-06-16 DIAGNOSIS — Z0000 Encounter for general adult medical examination without abnormal findings: Secondary | ICD-10-CM | POA: Diagnosis not present

## 2019-06-16 DIAGNOSIS — I10 Essential (primary) hypertension: Secondary | ICD-10-CM | POA: Diagnosis not present

## 2019-06-16 DIAGNOSIS — E7800 Pure hypercholesterolemia, unspecified: Secondary | ICD-10-CM | POA: Diagnosis not present

## 2019-06-16 DIAGNOSIS — Z79899 Other long term (current) drug therapy: Secondary | ICD-10-CM | POA: Diagnosis not present

## 2019-06-16 DIAGNOSIS — E039 Hypothyroidism, unspecified: Secondary | ICD-10-CM | POA: Diagnosis not present

## 2019-06-16 DIAGNOSIS — J4540 Moderate persistent asthma, uncomplicated: Secondary | ICD-10-CM | POA: Diagnosis not present

## 2019-06-16 DIAGNOSIS — K219 Gastro-esophageal reflux disease without esophagitis: Secondary | ICD-10-CM | POA: Diagnosis not present

## 2019-06-17 DIAGNOSIS — H401122 Primary open-angle glaucoma, left eye, moderate stage: Secondary | ICD-10-CM | POA: Diagnosis not present

## 2019-06-18 DIAGNOSIS — L570 Actinic keratosis: Secondary | ICD-10-CM | POA: Diagnosis not present

## 2019-06-18 DIAGNOSIS — D1801 Hemangioma of skin and subcutaneous tissue: Secondary | ICD-10-CM | POA: Diagnosis not present

## 2019-06-18 DIAGNOSIS — L821 Other seborrheic keratosis: Secondary | ICD-10-CM | POA: Diagnosis not present

## 2019-06-18 DIAGNOSIS — L814 Other melanin hyperpigmentation: Secondary | ICD-10-CM | POA: Diagnosis not present

## 2019-06-18 DIAGNOSIS — Z85828 Personal history of other malignant neoplasm of skin: Secondary | ICD-10-CM | POA: Diagnosis not present

## 2019-08-19 DIAGNOSIS — E7800 Pure hypercholesterolemia, unspecified: Secondary | ICD-10-CM | POA: Diagnosis not present

## 2019-08-19 DIAGNOSIS — E039 Hypothyroidism, unspecified: Secondary | ICD-10-CM | POA: Diagnosis not present

## 2019-08-29 DIAGNOSIS — E039 Hypothyroidism, unspecified: Secondary | ICD-10-CM | POA: Diagnosis not present

## 2019-08-29 DIAGNOSIS — K429 Umbilical hernia without obstruction or gangrene: Secondary | ICD-10-CM | POA: Diagnosis not present

## 2019-08-29 DIAGNOSIS — I10 Essential (primary) hypertension: Secondary | ICD-10-CM | POA: Diagnosis not present

## 2019-08-29 DIAGNOSIS — E7800 Pure hypercholesterolemia, unspecified: Secondary | ICD-10-CM | POA: Diagnosis not present

## 2019-09-06 ENCOUNTER — Other Ambulatory Visit: Payer: Self-pay | Admitting: Family Medicine

## 2019-09-06 DIAGNOSIS — K429 Umbilical hernia without obstruction or gangrene: Secondary | ICD-10-CM

## 2019-09-15 ENCOUNTER — Other Ambulatory Visit: Payer: BLUE CROSS/BLUE SHIELD

## 2019-09-17 ENCOUNTER — Ambulatory Visit
Admission: RE | Admit: 2019-09-17 | Discharge: 2019-09-17 | Disposition: A | Discharge status: Home / Self Care | Payer: Medicare Other | Source: Ambulatory Visit | Attending: Family Medicine | Admitting: Family Medicine

## 2019-09-17 DIAGNOSIS — K5730 Diverticulosis of large intestine without perforation or abscess without bleeding: Secondary | ICD-10-CM | POA: Diagnosis not present

## 2019-09-17 DIAGNOSIS — K429 Umbilical hernia without obstruction or gangrene: Secondary | ICD-10-CM

## 2019-10-09 DIAGNOSIS — H401122 Primary open-angle glaucoma, left eye, moderate stage: Secondary | ICD-10-CM | POA: Diagnosis not present

## 2019-12-23 DIAGNOSIS — L821 Other seborrheic keratosis: Secondary | ICD-10-CM | POA: Diagnosis not present

## 2019-12-23 DIAGNOSIS — L853 Xerosis cutis: Secondary | ICD-10-CM | POA: Diagnosis not present

## 2019-12-23 DIAGNOSIS — D225 Melanocytic nevi of trunk: Secondary | ICD-10-CM | POA: Diagnosis not present

## 2019-12-23 DIAGNOSIS — L570 Actinic keratosis: Secondary | ICD-10-CM | POA: Diagnosis not present

## 2019-12-23 DIAGNOSIS — D044 Carcinoma in situ of skin of scalp and neck: Secondary | ICD-10-CM | POA: Diagnosis not present

## 2019-12-23 DIAGNOSIS — D2272 Melanocytic nevi of left lower limb, including hip: Secondary | ICD-10-CM | POA: Diagnosis not present

## 2019-12-23 DIAGNOSIS — L814 Other melanin hyperpigmentation: Secondary | ICD-10-CM | POA: Diagnosis not present

## 2020-05-04 DIAGNOSIS — N410 Acute prostatitis: Secondary | ICD-10-CM | POA: Diagnosis not present

## 2020-05-08 DIAGNOSIS — H5789 Other specified disorders of eye and adnexa: Secondary | ICD-10-CM | POA: Diagnosis not present

## 2020-06-03 DIAGNOSIS — Z23 Encounter for immunization: Secondary | ICD-10-CM | POA: Diagnosis not present

## 2020-06-23 DIAGNOSIS — E039 Hypothyroidism, unspecified: Secondary | ICD-10-CM | POA: Diagnosis not present

## 2020-06-23 DIAGNOSIS — Z0000 Encounter for general adult medical examination without abnormal findings: Secondary | ICD-10-CM | POA: Diagnosis not present

## 2020-06-23 DIAGNOSIS — Z125 Encounter for screening for malignant neoplasm of prostate: Secondary | ICD-10-CM | POA: Diagnosis not present

## 2020-06-23 DIAGNOSIS — E7800 Pure hypercholesterolemia, unspecified: Secondary | ICD-10-CM | POA: Diagnosis not present

## 2020-06-23 DIAGNOSIS — Z79899 Other long term (current) drug therapy: Secondary | ICD-10-CM | POA: Diagnosis not present

## 2020-06-24 DIAGNOSIS — D225 Melanocytic nevi of trunk: Secondary | ICD-10-CM | POA: Diagnosis not present

## 2020-06-24 DIAGNOSIS — D1801 Hemangioma of skin and subcutaneous tissue: Secondary | ICD-10-CM | POA: Diagnosis not present

## 2020-06-24 DIAGNOSIS — L570 Actinic keratosis: Secondary | ICD-10-CM | POA: Diagnosis not present

## 2020-06-24 DIAGNOSIS — L814 Other melanin hyperpigmentation: Secondary | ICD-10-CM | POA: Diagnosis not present

## 2020-06-24 DIAGNOSIS — Z85828 Personal history of other malignant neoplasm of skin: Secondary | ICD-10-CM | POA: Diagnosis not present

## 2020-06-24 DIAGNOSIS — L821 Other seborrheic keratosis: Secondary | ICD-10-CM | POA: Diagnosis not present

## 2020-06-24 DIAGNOSIS — D2272 Melanocytic nevi of left lower limb, including hip: Secondary | ICD-10-CM | POA: Diagnosis not present

## 2020-06-25 DIAGNOSIS — E039 Hypothyroidism, unspecified: Secondary | ICD-10-CM | POA: Diagnosis not present

## 2020-06-25 DIAGNOSIS — I10 Essential (primary) hypertension: Secondary | ICD-10-CM | POA: Diagnosis not present

## 2020-06-25 DIAGNOSIS — E7800 Pure hypercholesterolemia, unspecified: Secondary | ICD-10-CM | POA: Diagnosis not present

## 2020-06-25 DIAGNOSIS — Z79899 Other long term (current) drug therapy: Secondary | ICD-10-CM | POA: Diagnosis not present

## 2020-06-25 DIAGNOSIS — J4540 Moderate persistent asthma, uncomplicated: Secondary | ICD-10-CM | POA: Diagnosis not present

## 2020-06-25 DIAGNOSIS — Z0001 Encounter for general adult medical examination with abnormal findings: Secondary | ICD-10-CM | POA: Diagnosis not present

## 2020-06-25 DIAGNOSIS — N1831 Chronic kidney disease, stage 3a: Secondary | ICD-10-CM | POA: Diagnosis not present

## 2020-06-25 DIAGNOSIS — I700 Atherosclerosis of aorta: Secondary | ICD-10-CM | POA: Diagnosis not present

## 2020-06-25 DIAGNOSIS — Z23 Encounter for immunization: Secondary | ICD-10-CM | POA: Diagnosis not present

## 2020-07-13 DIAGNOSIS — H401122 Primary open-angle glaucoma, left eye, moderate stage: Secondary | ICD-10-CM | POA: Diagnosis not present

## 2020-08-26 DIAGNOSIS — N401 Enlarged prostate with lower urinary tract symptoms: Secondary | ICD-10-CM | POA: Diagnosis not present

## 2020-08-26 DIAGNOSIS — R8279 Other abnormal findings on microbiological examination of urine: Secondary | ICD-10-CM | POA: Diagnosis not present

## 2020-08-26 DIAGNOSIS — R3915 Urgency of urination: Secondary | ICD-10-CM | POA: Diagnosis not present

## 2020-08-26 DIAGNOSIS — N528 Other male erectile dysfunction: Secondary | ICD-10-CM | POA: Diagnosis not present

## 2020-09-27 DIAGNOSIS — R946 Abnormal results of thyroid function studies: Secondary | ICD-10-CM | POA: Diagnosis not present

## 2020-09-27 DIAGNOSIS — E039 Hypothyroidism, unspecified: Secondary | ICD-10-CM | POA: Diagnosis not present

## 2020-09-27 DIAGNOSIS — N1831 Chronic kidney disease, stage 3a: Secondary | ICD-10-CM | POA: Diagnosis not present

## 2020-11-09 DIAGNOSIS — H401131 Primary open-angle glaucoma, bilateral, mild stage: Secondary | ICD-10-CM | POA: Diagnosis not present

## 2020-11-10 DIAGNOSIS — N644 Mastodynia: Secondary | ICD-10-CM | POA: Diagnosis not present

## 2020-11-13 ENCOUNTER — Other Ambulatory Visit: Payer: Self-pay | Admitting: Family Medicine

## 2020-11-13 DIAGNOSIS — N644 Mastodynia: Secondary | ICD-10-CM

## 2020-12-03 DIAGNOSIS — N528 Other male erectile dysfunction: Secondary | ICD-10-CM | POA: Diagnosis not present

## 2020-12-03 DIAGNOSIS — R3915 Urgency of urination: Secondary | ICD-10-CM | POA: Diagnosis not present

## 2020-12-03 DIAGNOSIS — N401 Enlarged prostate with lower urinary tract symptoms: Secondary | ICD-10-CM | POA: Diagnosis not present

## 2020-12-16 DIAGNOSIS — N528 Other male erectile dysfunction: Secondary | ICD-10-CM | POA: Diagnosis not present

## 2020-12-21 ENCOUNTER — Other Ambulatory Visit: Payer: Self-pay

## 2020-12-21 ENCOUNTER — Ambulatory Visit
Admission: RE | Admit: 2020-12-21 | Discharge: 2020-12-21 | Disposition: A | Discharge status: Home / Self Care | Payer: Medicare Other | Source: Ambulatory Visit | Attending: Family Medicine | Admitting: Family Medicine

## 2020-12-21 ENCOUNTER — Ambulatory Visit: Payer: Medicare Other

## 2020-12-21 DIAGNOSIS — N644 Mastodynia: Secondary | ICD-10-CM

## 2020-12-21 DIAGNOSIS — N62 Hypertrophy of breast: Secondary | ICD-10-CM | POA: Diagnosis not present

## 2020-12-22 DIAGNOSIS — L570 Actinic keratosis: Secondary | ICD-10-CM | POA: Diagnosis not present

## 2020-12-22 DIAGNOSIS — L821 Other seborrheic keratosis: Secondary | ICD-10-CM | POA: Diagnosis not present

## 2020-12-22 DIAGNOSIS — D1801 Hemangioma of skin and subcutaneous tissue: Secondary | ICD-10-CM | POA: Diagnosis not present

## 2020-12-22 DIAGNOSIS — L814 Other melanin hyperpigmentation: Secondary | ICD-10-CM | POA: Diagnosis not present

## 2020-12-22 DIAGNOSIS — D225 Melanocytic nevi of trunk: Secondary | ICD-10-CM | POA: Diagnosis not present

## 2020-12-27 DIAGNOSIS — N1831 Chronic kidney disease, stage 3a: Secondary | ICD-10-CM | POA: Diagnosis not present

## 2020-12-27 DIAGNOSIS — E7800 Pure hypercholesterolemia, unspecified: Secondary | ICD-10-CM | POA: Diagnosis not present

## 2020-12-27 DIAGNOSIS — M720 Palmar fascial fibromatosis [Dupuytren]: Secondary | ICD-10-CM | POA: Diagnosis not present

## 2020-12-27 DIAGNOSIS — E039 Hypothyroidism, unspecified: Secondary | ICD-10-CM | POA: Diagnosis not present

## 2020-12-27 DIAGNOSIS — I10 Essential (primary) hypertension: Secondary | ICD-10-CM | POA: Diagnosis not present

## 2021-01-07 DIAGNOSIS — Z23 Encounter for immunization: Secondary | ICD-10-CM | POA: Diagnosis not present

## 2021-02-09 DIAGNOSIS — U071 COVID-19: Secondary | ICD-10-CM | POA: Diagnosis not present

## 2021-02-25 DIAGNOSIS — M25551 Pain in right hip: Secondary | ICD-10-CM | POA: Diagnosis not present

## 2021-02-25 DIAGNOSIS — M25512 Pain in left shoulder: Secondary | ICD-10-CM | POA: Diagnosis not present

## 2021-02-25 DIAGNOSIS — U071 COVID-19: Secondary | ICD-10-CM | POA: Diagnosis not present

## 2021-03-01 DIAGNOSIS — E039 Hypothyroidism, unspecified: Secondary | ICD-10-CM | POA: Diagnosis not present

## 2021-03-22 DIAGNOSIS — H401122 Primary open-angle glaucoma, left eye, moderate stage: Secondary | ICD-10-CM | POA: Diagnosis not present

## 2021-05-23 DIAGNOSIS — Z23 Encounter for immunization: Secondary | ICD-10-CM | POA: Diagnosis not present

## 2021-05-26 DIAGNOSIS — Z23 Encounter for immunization: Secondary | ICD-10-CM | POA: Diagnosis not present

## 2021-06-24 DIAGNOSIS — L814 Other melanin hyperpigmentation: Secondary | ICD-10-CM | POA: Diagnosis not present

## 2021-06-24 DIAGNOSIS — L218 Other seborrheic dermatitis: Secondary | ICD-10-CM | POA: Diagnosis not present

## 2021-06-24 DIAGNOSIS — L821 Other seborrheic keratosis: Secondary | ICD-10-CM | POA: Diagnosis not present

## 2021-06-24 DIAGNOSIS — D1801 Hemangioma of skin and subcutaneous tissue: Secondary | ICD-10-CM | POA: Diagnosis not present

## 2021-06-24 DIAGNOSIS — C44719 Basal cell carcinoma of skin of left lower limb, including hip: Secondary | ICD-10-CM | POA: Diagnosis not present

## 2021-06-24 DIAGNOSIS — L570 Actinic keratosis: Secondary | ICD-10-CM | POA: Diagnosis not present

## 2021-07-05 DIAGNOSIS — Z23 Encounter for immunization: Secondary | ICD-10-CM | POA: Diagnosis not present

## 2021-07-05 DIAGNOSIS — K219 Gastro-esophageal reflux disease without esophagitis: Secondary | ICD-10-CM | POA: Diagnosis not present

## 2021-07-05 DIAGNOSIS — I10 Essential (primary) hypertension: Secondary | ICD-10-CM | POA: Diagnosis not present

## 2021-07-05 DIAGNOSIS — I700 Atherosclerosis of aorta: Secondary | ICD-10-CM | POA: Diagnosis not present

## 2021-07-05 DIAGNOSIS — Z125 Encounter for screening for malignant neoplasm of prostate: Secondary | ICD-10-CM | POA: Diagnosis not present

## 2021-07-05 DIAGNOSIS — Z0001 Encounter for general adult medical examination with abnormal findings: Secondary | ICD-10-CM | POA: Diagnosis not present

## 2021-07-05 DIAGNOSIS — N1831 Chronic kidney disease, stage 3a: Secondary | ICD-10-CM | POA: Diagnosis not present

## 2021-07-05 DIAGNOSIS — E039 Hypothyroidism, unspecified: Secondary | ICD-10-CM | POA: Diagnosis not present

## 2021-07-05 DIAGNOSIS — R1031 Right lower quadrant pain: Secondary | ICD-10-CM | POA: Diagnosis not present

## 2021-07-05 DIAGNOSIS — J4540 Moderate persistent asthma, uncomplicated: Secondary | ICD-10-CM | POA: Diagnosis not present

## 2021-07-05 DIAGNOSIS — E7800 Pure hypercholesterolemia, unspecified: Secondary | ICD-10-CM | POA: Diagnosis not present

## 2021-11-29 DIAGNOSIS — H2513 Age-related nuclear cataract, bilateral: Secondary | ICD-10-CM | POA: Diagnosis not present

## 2021-12-08 DIAGNOSIS — N528 Other male erectile dysfunction: Secondary | ICD-10-CM | POA: Diagnosis not present

## 2021-12-08 DIAGNOSIS — N401 Enlarged prostate with lower urinary tract symptoms: Secondary | ICD-10-CM | POA: Diagnosis not present

## 2021-12-08 DIAGNOSIS — R351 Nocturia: Secondary | ICD-10-CM | POA: Diagnosis not present

## 2021-12-22 DIAGNOSIS — L570 Actinic keratosis: Secondary | ICD-10-CM | POA: Diagnosis not present

## 2021-12-22 DIAGNOSIS — L814 Other melanin hyperpigmentation: Secondary | ICD-10-CM | POA: Diagnosis not present

## 2021-12-22 DIAGNOSIS — D1801 Hemangioma of skin and subcutaneous tissue: Secondary | ICD-10-CM | POA: Diagnosis not present

## 2021-12-22 DIAGNOSIS — D225 Melanocytic nevi of trunk: Secondary | ICD-10-CM | POA: Diagnosis not present

## 2021-12-22 DIAGNOSIS — Z85828 Personal history of other malignant neoplasm of skin: Secondary | ICD-10-CM | POA: Diagnosis not present

## 2021-12-22 DIAGNOSIS — L821 Other seborrheic keratosis: Secondary | ICD-10-CM | POA: Diagnosis not present

## 2022-01-19 DIAGNOSIS — W57XXXA Bitten or stung by nonvenomous insect and other nonvenomous arthropods, initial encounter: Secondary | ICD-10-CM | POA: Diagnosis not present

## 2022-01-19 DIAGNOSIS — S20461A Insect bite (nonvenomous) of right back wall of thorax, initial encounter: Secondary | ICD-10-CM | POA: Diagnosis not present

## 2022-02-05 IMAGING — MG DIGITAL DIAGNOSTIC BILAT W/ TOMO W/ CAD
8 series · 8 of 24 positions shown · non-contrast
Comparison: None

ACR Breast Density Category a: The breast tissue is almost entirely
fatty.

CLINICAL DATA: 65-year-old male with LEFT RETROAREOLAR breast
sensitivity/pain.

EXAM:
DIGITAL DIAGNOSTIC BILATERAL MAMMOGRAM WITH CAD AND TOMO

[R MLO synth-2D]
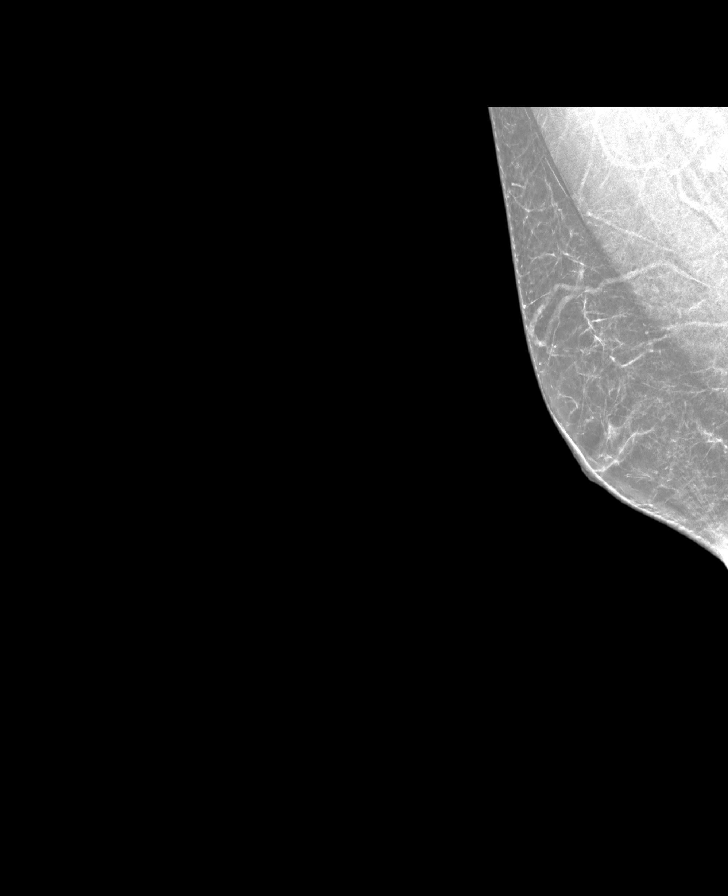

[R CC synth-2D]
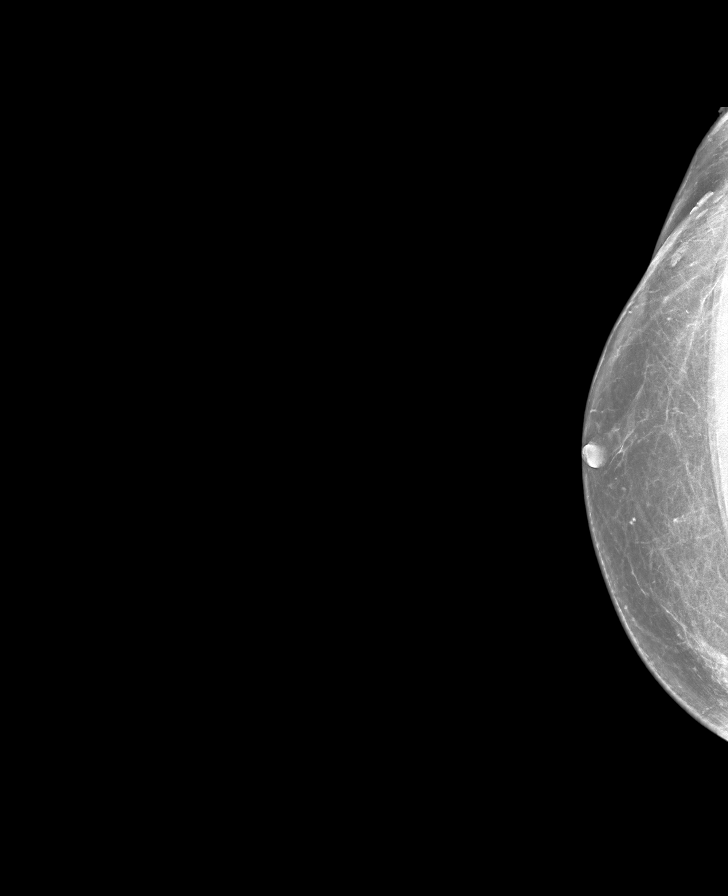

[L MLO synth-2D]
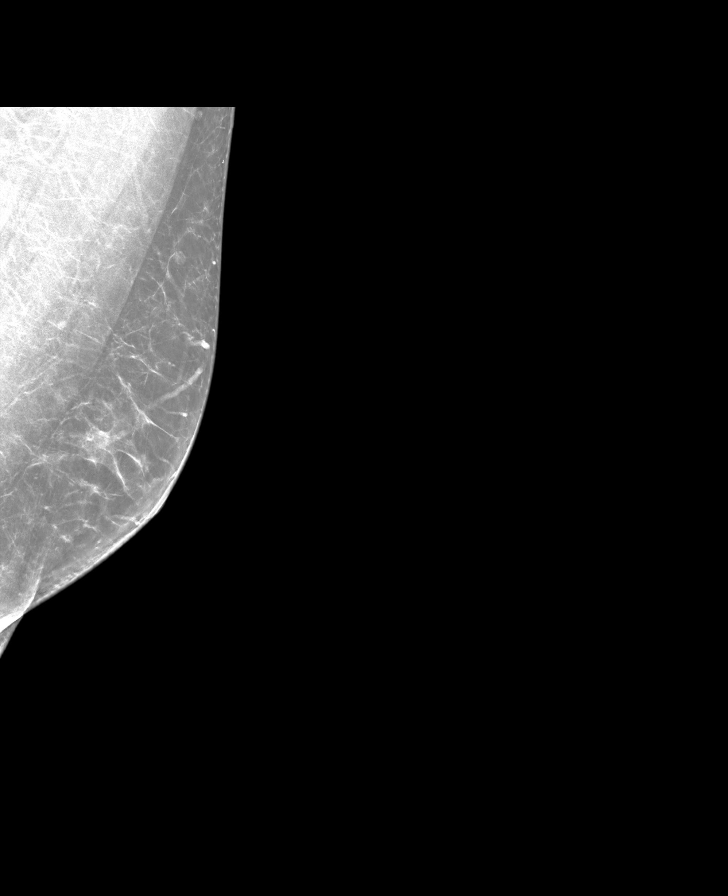

[L CC synth-2D]
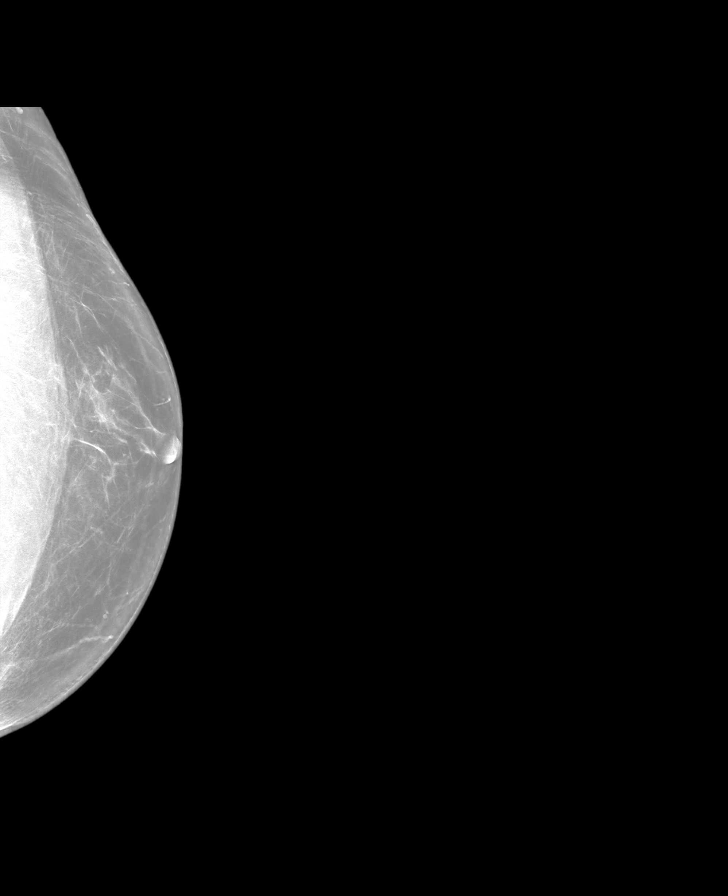

[R MLO tomo · tomo slice 32/63.0]
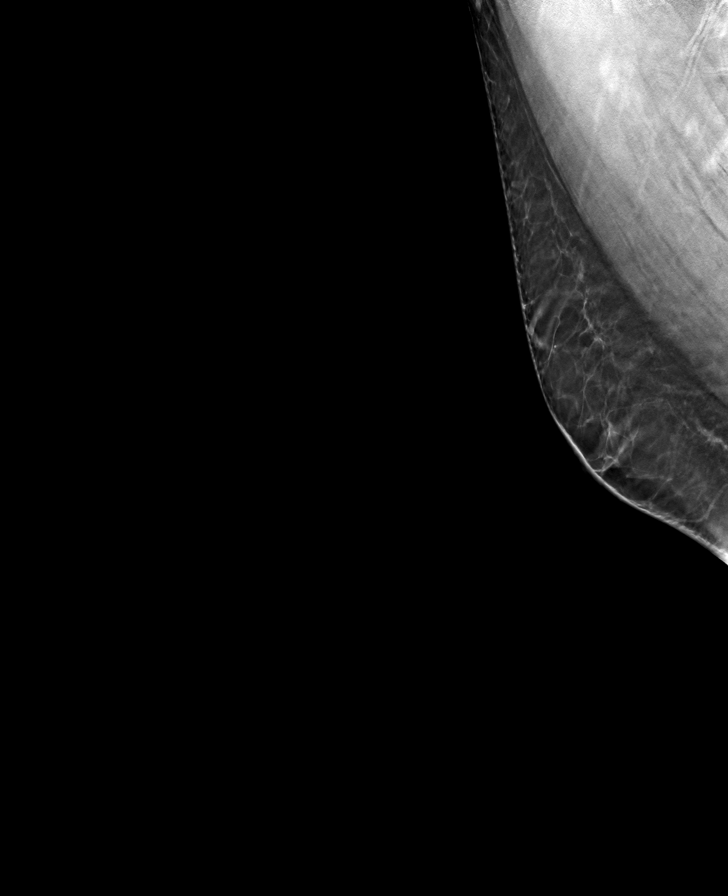

[R CC tomo · tomo slice 39/77.0]
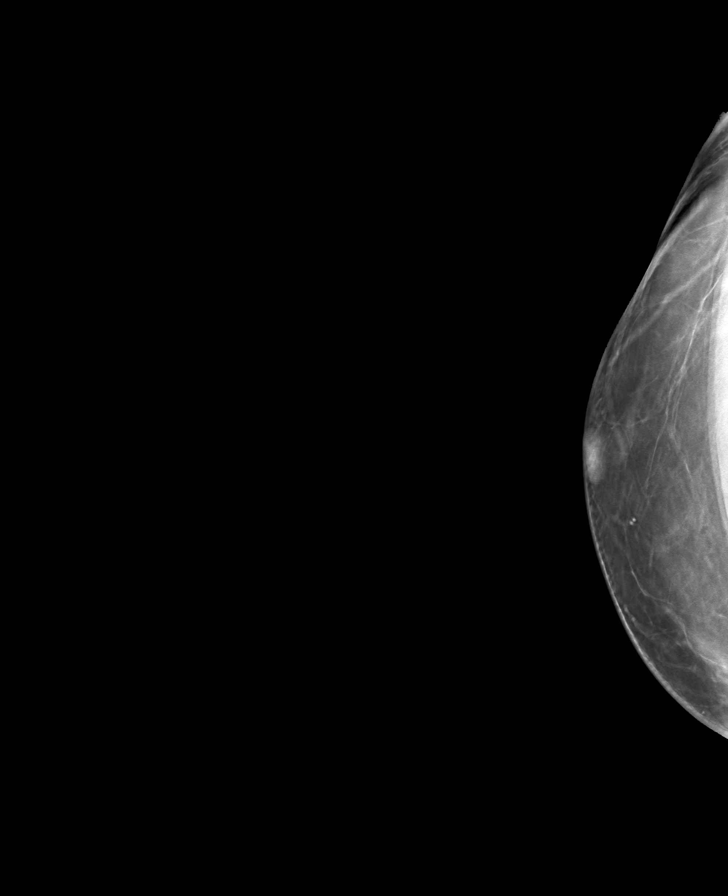

[L MLO tomo · tomo slice 37/72.0]
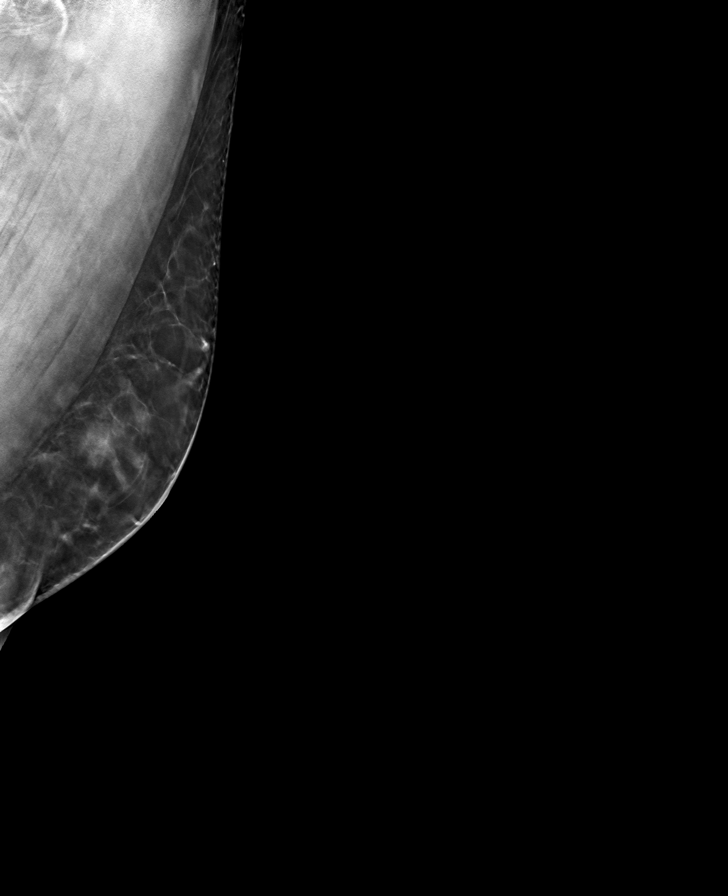

[L CC tomo · tomo slice 39/76.0]
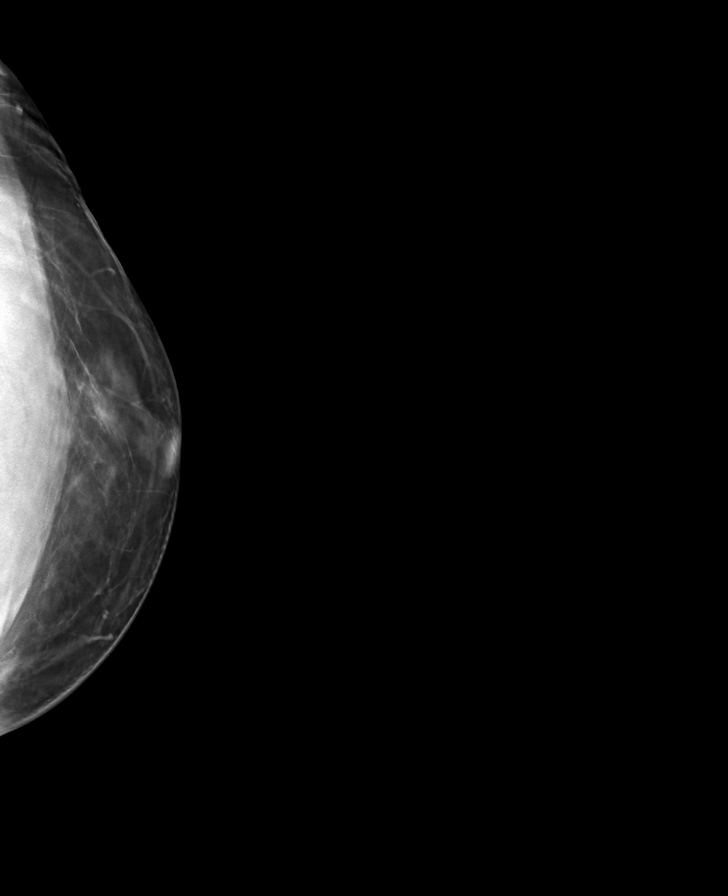

[8 of 24 positions shown; findings below may reference images not displayed]

FINDINGS: 2D/3D full field views of both breasts demonstrate mild LEFT
gynecomastia.

There is no mammographic evidence of breast malignancy.

Mammographic images were processed with CAD.
IMPRESSION: 1. Mild LEFT gynecomastia.
2. No evidence of breast malignancy.

RECOMMENDATION:
Consider clinical follow-up as indicated. Any further workup should
be based on clinical grounds.

I have discussed the findings, causes of gynecomastia and
recommendations with the patient.

BI-RADS CATEGORY  2: Benign.

## 2022-02-22 DIAGNOSIS — I10 Essential (primary) hypertension: Secondary | ICD-10-CM | POA: Diagnosis not present

## 2022-02-22 DIAGNOSIS — R0602 Shortness of breath: Secondary | ICD-10-CM | POA: Diagnosis not present

## 2022-02-22 DIAGNOSIS — K219 Gastro-esophageal reflux disease without esophagitis: Secondary | ICD-10-CM | POA: Diagnosis not present

## 2022-02-22 DIAGNOSIS — E7800 Pure hypercholesterolemia, unspecified: Secondary | ICD-10-CM | POA: Diagnosis not present

## 2022-02-22 DIAGNOSIS — E039 Hypothyroidism, unspecified: Secondary | ICD-10-CM | POA: Diagnosis not present

## 2022-02-22 DIAGNOSIS — N1831 Chronic kidney disease, stage 3a: Secondary | ICD-10-CM | POA: Diagnosis not present

## 2022-02-22 DIAGNOSIS — I700 Atherosclerosis of aorta: Secondary | ICD-10-CM | POA: Diagnosis not present

## 2022-02-24 ENCOUNTER — Other Ambulatory Visit (HOSPITAL_COMMUNITY): Payer: Self-pay | Admitting: Family Medicine

## 2022-02-24 DIAGNOSIS — R0602 Shortness of breath: Secondary | ICD-10-CM

## 2022-03-09 ENCOUNTER — Ambulatory Visit (HOSPITAL_COMMUNITY): Payer: Medicare Other | Attending: Family Medicine

## 2022-03-09 DIAGNOSIS — R0602 Shortness of breath: Secondary | ICD-10-CM

## 2022-03-09 LAB — ECHOCARDIOGRAM COMPLETE
Area-P 1/2: 3.83 cm2
S' Lateral: 2.8 cm

## 2022-03-16 DIAGNOSIS — H401131 Primary open-angle glaucoma, bilateral, mild stage: Secondary | ICD-10-CM | POA: Diagnosis not present

## 2022-04-27 DIAGNOSIS — E039 Hypothyroidism, unspecified: Secondary | ICD-10-CM | POA: Diagnosis not present

## 2022-04-28 DIAGNOSIS — Z23 Encounter for immunization: Secondary | ICD-10-CM | POA: Diagnosis not present

## 2022-05-11 DIAGNOSIS — Z23 Encounter for immunization: Secondary | ICD-10-CM | POA: Diagnosis not present

## 2022-06-27 DIAGNOSIS — M25512 Pain in left shoulder: Secondary | ICD-10-CM | POA: Diagnosis not present

## 2022-07-07 DIAGNOSIS — Z1159 Encounter for screening for other viral diseases: Secondary | ICD-10-CM | POA: Diagnosis not present

## 2022-07-07 DIAGNOSIS — E039 Hypothyroidism, unspecified: Secondary | ICD-10-CM | POA: Diagnosis not present

## 2022-07-07 DIAGNOSIS — I10 Essential (primary) hypertension: Secondary | ICD-10-CM | POA: Diagnosis not present

## 2022-07-07 DIAGNOSIS — Z125 Encounter for screening for malignant neoplasm of prostate: Secondary | ICD-10-CM | POA: Diagnosis not present

## 2022-07-07 DIAGNOSIS — K219 Gastro-esophageal reflux disease without esophagitis: Secondary | ICD-10-CM | POA: Diagnosis not present

## 2022-07-07 DIAGNOSIS — N1831 Chronic kidney disease, stage 3a: Secondary | ICD-10-CM | POA: Diagnosis not present

## 2022-07-07 DIAGNOSIS — I700 Atherosclerosis of aorta: Secondary | ICD-10-CM | POA: Diagnosis not present

## 2022-07-07 DIAGNOSIS — J4540 Moderate persistent asthma, uncomplicated: Secondary | ICD-10-CM | POA: Diagnosis not present

## 2022-07-07 DIAGNOSIS — E7800 Pure hypercholesterolemia, unspecified: Secondary | ICD-10-CM | POA: Diagnosis not present

## 2022-07-07 DIAGNOSIS — Z0001 Encounter for general adult medical examination with abnormal findings: Secondary | ICD-10-CM | POA: Diagnosis not present

## 2022-07-16 DIAGNOSIS — M25512 Pain in left shoulder: Secondary | ICD-10-CM | POA: Diagnosis not present

## 2022-07-18 DIAGNOSIS — M75112 Incomplete rotator cuff tear or rupture of left shoulder, not specified as traumatic: Secondary | ICD-10-CM | POA: Diagnosis not present

## 2022-07-18 DIAGNOSIS — M25512 Pain in left shoulder: Secondary | ICD-10-CM | POA: Diagnosis not present

## 2022-07-19 DIAGNOSIS — M25512 Pain in left shoulder: Secondary | ICD-10-CM | POA: Diagnosis not present

## 2022-08-17 DIAGNOSIS — H401131 Primary open-angle glaucoma, bilateral, mild stage: Secondary | ICD-10-CM | POA: Diagnosis not present

## 2022-09-01 DIAGNOSIS — L570 Actinic keratosis: Secondary | ICD-10-CM | POA: Diagnosis not present

## 2022-09-01 DIAGNOSIS — L821 Other seborrheic keratosis: Secondary | ICD-10-CM | POA: Diagnosis not present

## 2022-09-01 DIAGNOSIS — L814 Other melanin hyperpigmentation: Secondary | ICD-10-CM | POA: Diagnosis not present

## 2022-09-01 DIAGNOSIS — D1801 Hemangioma of skin and subcutaneous tissue: Secondary | ICD-10-CM | POA: Diagnosis not present

## 2022-09-01 DIAGNOSIS — D225 Melanocytic nevi of trunk: Secondary | ICD-10-CM | POA: Diagnosis not present

## 2022-09-19 DIAGNOSIS — M25512 Pain in left shoulder: Secondary | ICD-10-CM | POA: Diagnosis not present

## 2022-10-10 DIAGNOSIS — E039 Hypothyroidism, unspecified: Secondary | ICD-10-CM | POA: Diagnosis not present

## 2022-10-17 DIAGNOSIS — M25512 Pain in left shoulder: Secondary | ICD-10-CM | POA: Diagnosis not present

## 2022-11-21 DIAGNOSIS — M25512 Pain in left shoulder: Secondary | ICD-10-CM | POA: Diagnosis not present

## 2022-11-28 DIAGNOSIS — M25512 Pain in left shoulder: Secondary | ICD-10-CM | POA: Diagnosis not present

## 2022-12-04 DIAGNOSIS — M25512 Pain in left shoulder: Secondary | ICD-10-CM | POA: Diagnosis not present

## 2022-12-20 DIAGNOSIS — M7541 Impingement syndrome of right shoulder: Secondary | ICD-10-CM | POA: Diagnosis not present

## 2022-12-20 DIAGNOSIS — M25512 Pain in left shoulder: Secondary | ICD-10-CM | POA: Diagnosis not present

## 2022-12-21 DIAGNOSIS — R972 Elevated prostate specific antigen [PSA]: Secondary | ICD-10-CM | POA: Diagnosis not present

## 2022-12-21 DIAGNOSIS — N528 Other male erectile dysfunction: Secondary | ICD-10-CM | POA: Diagnosis not present

## 2022-12-21 DIAGNOSIS — R351 Nocturia: Secondary | ICD-10-CM | POA: Diagnosis not present

## 2022-12-21 DIAGNOSIS — N401 Enlarged prostate with lower urinary tract symptoms: Secondary | ICD-10-CM | POA: Diagnosis not present

## 2022-12-26 DIAGNOSIS — H401131 Primary open-angle glaucoma, bilateral, mild stage: Secondary | ICD-10-CM | POA: Diagnosis not present

## 2023-01-02 DIAGNOSIS — M659 Synovitis and tenosynovitis, unspecified: Secondary | ICD-10-CM | POA: Diagnosis not present

## 2023-01-02 DIAGNOSIS — S43432A Superior glenoid labrum lesion of left shoulder, initial encounter: Secondary | ICD-10-CM | POA: Diagnosis not present

## 2023-01-02 DIAGNOSIS — Y999 Unspecified external cause status: Secondary | ICD-10-CM | POA: Diagnosis not present

## 2023-01-02 DIAGNOSIS — G8918 Other acute postprocedural pain: Secondary | ICD-10-CM | POA: Diagnosis not present

## 2023-01-02 DIAGNOSIS — M19012 Primary osteoarthritis, left shoulder: Secondary | ICD-10-CM | POA: Diagnosis not present

## 2023-01-02 DIAGNOSIS — M7542 Impingement syndrome of left shoulder: Secondary | ICD-10-CM | POA: Diagnosis not present

## 2023-01-02 DIAGNOSIS — M75112 Incomplete rotator cuff tear or rupture of left shoulder, not specified as traumatic: Secondary | ICD-10-CM | POA: Diagnosis not present

## 2023-01-02 DIAGNOSIS — X58XXXA Exposure to other specified factors, initial encounter: Secondary | ICD-10-CM | POA: Diagnosis not present

## 2023-01-09 DIAGNOSIS — N1831 Chronic kidney disease, stage 3a: Secondary | ICD-10-CM | POA: Diagnosis not present

## 2023-01-09 DIAGNOSIS — L219 Seborrheic dermatitis, unspecified: Secondary | ICD-10-CM | POA: Diagnosis not present

## 2023-01-09 DIAGNOSIS — E039 Hypothyroidism, unspecified: Secondary | ICD-10-CM | POA: Diagnosis not present

## 2023-01-12 DIAGNOSIS — M25512 Pain in left shoulder: Secondary | ICD-10-CM | POA: Diagnosis not present

## 2023-01-12 DIAGNOSIS — M25612 Stiffness of left shoulder, not elsewhere classified: Secondary | ICD-10-CM | POA: Diagnosis not present

## 2023-01-18 DIAGNOSIS — M25612 Stiffness of left shoulder, not elsewhere classified: Secondary | ICD-10-CM | POA: Diagnosis not present

## 2023-01-18 DIAGNOSIS — M25512 Pain in left shoulder: Secondary | ICD-10-CM | POA: Diagnosis not present

## 2023-01-24 DIAGNOSIS — H401122 Primary open-angle glaucoma, left eye, moderate stage: Secondary | ICD-10-CM | POA: Diagnosis not present

## 2023-01-25 DIAGNOSIS — M25512 Pain in left shoulder: Secondary | ICD-10-CM | POA: Diagnosis not present

## 2023-01-25 DIAGNOSIS — M25612 Stiffness of left shoulder, not elsewhere classified: Secondary | ICD-10-CM | POA: Diagnosis not present

## 2023-02-01 DIAGNOSIS — M25512 Pain in left shoulder: Secondary | ICD-10-CM | POA: Diagnosis not present

## 2023-02-01 DIAGNOSIS — M25612 Stiffness of left shoulder, not elsewhere classified: Secondary | ICD-10-CM | POA: Diagnosis not present

## 2023-02-15 DIAGNOSIS — M25512 Pain in left shoulder: Secondary | ICD-10-CM | POA: Diagnosis not present

## 2023-02-15 DIAGNOSIS — M25612 Stiffness of left shoulder, not elsewhere classified: Secondary | ICD-10-CM | POA: Diagnosis not present

## 2023-02-22 DIAGNOSIS — L820 Inflamed seborrheic keratosis: Secondary | ICD-10-CM | POA: Diagnosis not present

## 2023-02-22 DIAGNOSIS — D225 Melanocytic nevi of trunk: Secondary | ICD-10-CM | POA: Diagnosis not present

## 2023-02-22 DIAGNOSIS — D1801 Hemangioma of skin and subcutaneous tissue: Secondary | ICD-10-CM | POA: Diagnosis not present

## 2023-02-22 DIAGNOSIS — M25612 Stiffness of left shoulder, not elsewhere classified: Secondary | ICD-10-CM | POA: Diagnosis not present

## 2023-02-22 DIAGNOSIS — D2261 Melanocytic nevi of right upper limb, including shoulder: Secondary | ICD-10-CM | POA: Diagnosis not present

## 2023-02-22 DIAGNOSIS — M25512 Pain in left shoulder: Secondary | ICD-10-CM | POA: Diagnosis not present

## 2023-02-22 DIAGNOSIS — L570 Actinic keratosis: Secondary | ICD-10-CM | POA: Diagnosis not present

## 2023-02-22 DIAGNOSIS — L821 Other seborrheic keratosis: Secondary | ICD-10-CM | POA: Diagnosis not present

## 2023-03-01 DIAGNOSIS — M25512 Pain in left shoulder: Secondary | ICD-10-CM | POA: Diagnosis not present

## 2023-03-01 DIAGNOSIS — M25612 Stiffness of left shoulder, not elsewhere classified: Secondary | ICD-10-CM | POA: Diagnosis not present

## 2023-03-06 DIAGNOSIS — M25512 Pain in left shoulder: Secondary | ICD-10-CM | POA: Diagnosis not present

## 2023-03-06 DIAGNOSIS — M25612 Stiffness of left shoulder, not elsewhere classified: Secondary | ICD-10-CM | POA: Diagnosis not present

## 2023-03-07 DIAGNOSIS — M25512 Pain in left shoulder: Secondary | ICD-10-CM | POA: Diagnosis not present

## 2023-03-07 DIAGNOSIS — M25612 Stiffness of left shoulder, not elsewhere classified: Secondary | ICD-10-CM | POA: Diagnosis not present

## 2023-03-13 DIAGNOSIS — M25512 Pain in left shoulder: Secondary | ICD-10-CM | POA: Diagnosis not present

## 2023-03-13 DIAGNOSIS — M25612 Stiffness of left shoulder, not elsewhere classified: Secondary | ICD-10-CM | POA: Diagnosis not present

## 2023-03-15 DIAGNOSIS — M25512 Pain in left shoulder: Secondary | ICD-10-CM | POA: Diagnosis not present

## 2023-03-15 DIAGNOSIS — M25612 Stiffness of left shoulder, not elsewhere classified: Secondary | ICD-10-CM | POA: Diagnosis not present

## 2023-03-20 DIAGNOSIS — M25612 Stiffness of left shoulder, not elsewhere classified: Secondary | ICD-10-CM | POA: Diagnosis not present

## 2023-03-20 DIAGNOSIS — M25512 Pain in left shoulder: Secondary | ICD-10-CM | POA: Diagnosis not present

## 2023-03-22 DIAGNOSIS — M25612 Stiffness of left shoulder, not elsewhere classified: Secondary | ICD-10-CM | POA: Diagnosis not present

## 2023-03-22 DIAGNOSIS — M25512 Pain in left shoulder: Secondary | ICD-10-CM | POA: Diagnosis not present

## 2023-03-27 DIAGNOSIS — M25612 Stiffness of left shoulder, not elsewhere classified: Secondary | ICD-10-CM | POA: Diagnosis not present

## 2023-03-27 DIAGNOSIS — M25512 Pain in left shoulder: Secondary | ICD-10-CM | POA: Diagnosis not present

## 2023-03-29 DIAGNOSIS — M25512 Pain in left shoulder: Secondary | ICD-10-CM | POA: Diagnosis not present

## 2023-03-29 DIAGNOSIS — M25612 Stiffness of left shoulder, not elsewhere classified: Secondary | ICD-10-CM | POA: Diagnosis not present

## 2023-04-03 DIAGNOSIS — M25612 Stiffness of left shoulder, not elsewhere classified: Secondary | ICD-10-CM | POA: Diagnosis not present

## 2023-04-03 DIAGNOSIS — M25512 Pain in left shoulder: Secondary | ICD-10-CM | POA: Diagnosis not present

## 2023-04-05 DIAGNOSIS — M25512 Pain in left shoulder: Secondary | ICD-10-CM | POA: Diagnosis not present

## 2023-04-05 DIAGNOSIS — M25612 Stiffness of left shoulder, not elsewhere classified: Secondary | ICD-10-CM | POA: Diagnosis not present

## 2023-04-10 DIAGNOSIS — M25512 Pain in left shoulder: Secondary | ICD-10-CM | POA: Diagnosis not present

## 2023-04-10 DIAGNOSIS — M25612 Stiffness of left shoulder, not elsewhere classified: Secondary | ICD-10-CM | POA: Diagnosis not present

## 2023-04-12 DIAGNOSIS — M25512 Pain in left shoulder: Secondary | ICD-10-CM | POA: Diagnosis not present

## 2023-04-12 DIAGNOSIS — M25612 Stiffness of left shoulder, not elsewhere classified: Secondary | ICD-10-CM | POA: Diagnosis not present

## 2023-04-17 DIAGNOSIS — M25612 Stiffness of left shoulder, not elsewhere classified: Secondary | ICD-10-CM | POA: Diagnosis not present

## 2023-04-17 DIAGNOSIS — M25512 Pain in left shoulder: Secondary | ICD-10-CM | POA: Diagnosis not present

## 2023-04-19 DIAGNOSIS — M25612 Stiffness of left shoulder, not elsewhere classified: Secondary | ICD-10-CM | POA: Diagnosis not present

## 2023-04-19 DIAGNOSIS — M25512 Pain in left shoulder: Secondary | ICD-10-CM | POA: Diagnosis not present

## 2023-04-24 DIAGNOSIS — M25512 Pain in left shoulder: Secondary | ICD-10-CM | POA: Diagnosis not present

## 2023-04-24 DIAGNOSIS — M25612 Stiffness of left shoulder, not elsewhere classified: Secondary | ICD-10-CM | POA: Diagnosis not present

## 2023-04-26 DIAGNOSIS — Z23 Encounter for immunization: Secondary | ICD-10-CM | POA: Diagnosis not present

## 2023-04-26 DIAGNOSIS — M25512 Pain in left shoulder: Secondary | ICD-10-CM | POA: Diagnosis not present

## 2023-04-26 DIAGNOSIS — M25612 Stiffness of left shoulder, not elsewhere classified: Secondary | ICD-10-CM | POA: Diagnosis not present

## 2023-05-01 DIAGNOSIS — M25512 Pain in left shoulder: Secondary | ICD-10-CM | POA: Diagnosis not present

## 2023-05-01 DIAGNOSIS — M25612 Stiffness of left shoulder, not elsewhere classified: Secondary | ICD-10-CM | POA: Diagnosis not present

## 2023-05-03 DIAGNOSIS — M25612 Stiffness of left shoulder, not elsewhere classified: Secondary | ICD-10-CM | POA: Diagnosis not present

## 2023-05-03 DIAGNOSIS — M25512 Pain in left shoulder: Secondary | ICD-10-CM | POA: Diagnosis not present

## 2023-05-07 DIAGNOSIS — M25512 Pain in left shoulder: Secondary | ICD-10-CM | POA: Diagnosis not present

## 2023-05-07 DIAGNOSIS — M25612 Stiffness of left shoulder, not elsewhere classified: Secondary | ICD-10-CM | POA: Diagnosis not present

## 2023-05-09 DIAGNOSIS — M25612 Stiffness of left shoulder, not elsewhere classified: Secondary | ICD-10-CM | POA: Diagnosis not present

## 2023-05-09 DIAGNOSIS — M25512 Pain in left shoulder: Secondary | ICD-10-CM | POA: Diagnosis not present

## 2023-05-19 DIAGNOSIS — Z23 Encounter for immunization: Secondary | ICD-10-CM | POA: Diagnosis not present

## 2023-06-13 DIAGNOSIS — Z4789 Encounter for other orthopedic aftercare: Secondary | ICD-10-CM | POA: Diagnosis not present

## 2023-06-26 DIAGNOSIS — H401122 Primary open-angle glaucoma, left eye, moderate stage: Secondary | ICD-10-CM | POA: Diagnosis not present

## 2023-07-17 DIAGNOSIS — R0602 Shortness of breath: Secondary | ICD-10-CM | POA: Diagnosis not present

## 2023-07-17 DIAGNOSIS — E7800 Pure hypercholesterolemia, unspecified: Secondary | ICD-10-CM | POA: Diagnosis not present

## 2023-07-17 DIAGNOSIS — Z0001 Encounter for general adult medical examination with abnormal findings: Secondary | ICD-10-CM | POA: Diagnosis not present

## 2023-07-17 DIAGNOSIS — E039 Hypothyroidism, unspecified: Secondary | ICD-10-CM | POA: Diagnosis not present

## 2023-07-17 DIAGNOSIS — Z125 Encounter for screening for malignant neoplasm of prostate: Secondary | ICD-10-CM | POA: Diagnosis not present

## 2023-07-17 DIAGNOSIS — N1831 Chronic kidney disease, stage 3a: Secondary | ICD-10-CM | POA: Diagnosis not present

## 2023-07-17 DIAGNOSIS — Z1331 Encounter for screening for depression: Secondary | ICD-10-CM | POA: Diagnosis not present

## 2023-07-17 DIAGNOSIS — K429 Umbilical hernia without obstruction or gangrene: Secondary | ICD-10-CM | POA: Diagnosis not present

## 2023-07-23 DIAGNOSIS — K429 Umbilical hernia without obstruction or gangrene: Secondary | ICD-10-CM | POA: Diagnosis not present

## 2023-08-30 DIAGNOSIS — L814 Other melanin hyperpigmentation: Secondary | ICD-10-CM | POA: Diagnosis not present

## 2023-08-30 DIAGNOSIS — L820 Inflamed seborrheic keratosis: Secondary | ICD-10-CM | POA: Diagnosis not present

## 2023-08-30 DIAGNOSIS — L570 Actinic keratosis: Secondary | ICD-10-CM | POA: Diagnosis not present

## 2023-08-30 DIAGNOSIS — D485 Neoplasm of uncertain behavior of skin: Secondary | ICD-10-CM | POA: Diagnosis not present

## 2023-08-30 DIAGNOSIS — D044 Carcinoma in situ of skin of scalp and neck: Secondary | ICD-10-CM | POA: Diagnosis not present

## 2023-08-30 DIAGNOSIS — D225 Melanocytic nevi of trunk: Secondary | ICD-10-CM | POA: Diagnosis not present

## 2023-08-30 DIAGNOSIS — L821 Other seborrheic keratosis: Secondary | ICD-10-CM | POA: Diagnosis not present

## 2023-09-28 DIAGNOSIS — N1831 Chronic kidney disease, stage 3a: Secondary | ICD-10-CM | POA: Diagnosis not present

## 2023-10-22 ENCOUNTER — Ambulatory Visit: Payer: Medicare Other | Admitting: Pulmonary Disease

## 2023-10-22 ENCOUNTER — Encounter: Payer: Self-pay | Admitting: Pulmonary Disease

## 2023-10-22 ENCOUNTER — Ambulatory Visit

## 2023-10-22 ENCOUNTER — Other Ambulatory Visit: Payer: Self-pay | Admitting: Medical Genetics

## 2023-10-22 VITALS — BP 133/82 | HR 61 | Ht 67.0 in | Wt 196.4 lb

## 2023-10-22 DIAGNOSIS — K219 Gastro-esophageal reflux disease without esophagitis: Secondary | ICD-10-CM | POA: Diagnosis not present

## 2023-10-22 DIAGNOSIS — R0600 Dyspnea, unspecified: Secondary | ICD-10-CM | POA: Diagnosis not present

## 2023-10-22 DIAGNOSIS — I10 Essential (primary) hypertension: Secondary | ICD-10-CM | POA: Diagnosis not present

## 2023-10-22 DIAGNOSIS — E785 Hyperlipidemia, unspecified: Secondary | ICD-10-CM

## 2023-10-22 MED ORDER — BUDESONIDE-FORMOTEROL FUMARATE 160-4.5 MCG/ACT IN AERO: 2.0000 | INHALATION_SPRAY | Freq: Two times a day (BID) | RESPIRATORY_TRACT | 3 refills | Status: AC

## 2023-10-22 MED ORDER — ALBUTEROL SULFATE HFA 108 (90 BASE) MCG/ACT IN AERS: 2.0000 | INHALATION_SPRAY | Freq: Four times a day (QID) | RESPIRATORY_TRACT | 6 refills | Status: AC | PRN

## 2023-10-22 NOTE — Patient Instructions (Signed)
 I will see you in about 6 to 8 weeks  We will get a chest x-ray today  Ultrasound of the heart to check cardiac function ordered  Pulmonary function test with methacholine challenge study ordered  Prescription for albuterol to be used before exercise  Prescription for Symbicort-steroid plus long-acting albuterol to be used 2 puffs twice a day, ensure that you rinse after use  Continue graded exercise with escalation as tolerated  Call us with significant concerns

## 2023-10-22 NOTE — Progress Notes (Signed)
 Derrick Green    [id number]    [birth year]  Primary Care Physician:Koirala, Dibas, MD  Referring Physician: [name], MD [address] [place],  [place] [address]  Chief complaint:    Patient being seen for shortness of breath HPI:  Progressive shortness of breath, Done for exercise-induced asthma  Has had chronic shortness of breath for over 10 years  Has tried inhalers in the past which she felt did not really affect his breathing much, activity tolerance was about the same despite inhaler use  He did a lot of running when he was younger, progressively got more short of breath and ultimately had to stop running  Started swimming but lately unable to swim for significant distances.  Stated he was able to swim for over 1000 years prior and now he has to stop every 50 to 60 yards to catch his breath for almost 2 minutes sometimes  Shoulder surgery May of last year, has not been able to rebound from that  He tries to stay active, walks about 3 miles 3 times a week, he walks without difficulty Walks his dog on a regular basis as well  No significant limitation if he is walking on level ground  Did try a steroid inhaler in the past as well felt it did not help  Denies any wheezing No chest pain or chest discomfort  Family history of asthma with his dad and sister  Never smoked, worked in an office environment  Weight gain of about 20 pounds, symptoms of worsening shortness of breath started before the weight gain.  Does have symptoms of acid reflux, controlled  Pets: Occupation: Exposures: Smoking history: Travel history: Relevant family history:  Outpatient Encounter Medications as of 10/22/2023  Medication Sig   atorvastatin (LIPITOR) 40 MG tablet Take 40 mg by mouth daily.   dorzolamide-timolol (COSOPT) 22.3-6.8 MG/ML ophthalmic solution Place 1 drop into both eyes 2 (two) times daily.   hydrochlorothiazide (HYDRODIURIL) 25 MG  tablet Take 25 mg by mouth daily.   levothyroxine (SYNTHROID, LEVOTHROID) 150 MCG tablet Take 150 mcg by mouth daily before breakfast.   loratadine (CLARITIN) 10 MG tablet Take 10 mg by mouth daily as needed for allergies.   tamsulosin (FLOMAX) 0.4 MG CAPS capsule Take 0.4 mg by mouth.   [DISCONTINUED] Multiple Vitamin (ONE-A-DAY MENS PO) Take 1 tablet by mouth daily.   [DISCONTINUED] simvastatin (ZOCOR) 20 MG tablet Take 20 mg by mouth daily.   calcium carbonate (OS-CAL - DOSED IN MG OF ELEMENTAL CALCIUM) 1250 (500 Ca) MG tablet Take 2 tablets (1,000 mg of elemental calcium total) by mouth 2 (two) times daily with a meal. (Patient not taking: Reported on 10/22/2023)   [DISCONTINUED] brimonidine (ALPHAGAN) 0.2 % ophthalmic solution Place 1 drop into both eyes 2 (two) times daily.   [DISCONTINUED] HYDROmorphone (DILAUDID) 2 MG tablet Take 1-2 tablets (2-4 mg total) by mouth every 4 (four) hours as needed for moderate pain. (Patient not taking: Reported on 10/22/2023)   No facility-administered encounter medications on file as of 10/22/2023.    Allergies as of 10/22/2023 - Review Complete 10/22/2023  Allergen Reaction Noted   Penicillin g Anaphylaxis 11/05/2015   Morphine and codeine Other (See Comments) 11/09/2015    Past Medical History:  Diagnosis Date   Exercise-induced asthma    GERD (gastroesophageal reflux disease)    in past, not currently   Head injury due to trauma 1971; 1972   playing  football in high school   High cholesterol    Hypothyroidism    Pneumonia 1960s X 1   Situational depression 2009   "a little after my wife died"   Thyroid mass     Past Surgical History:  Procedure Laterality Date   COLONOSCOPY W/ BIOPSIES AND POLYPECTOMY  2017   SHOULDER ARTHROSCOPY Right    THYROIDECTOMY N/A 11/15/2015   Procedure: TOTAL THYROIDECTOMY;  Surgeon: Darnell Level, MD;  Location: [facility] OR;  Service: General;  Laterality: N/A;   TONSILLECTOMY  1959   TOTAL THYROIDECTOMY  11/15/2015    vocal fold  ~ 2007   "repairing vocal cord; 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  Social History   Socioeconomic History   Marital status: Married    Spouse name: Not on file   Number of children: Not on file   Years of education: Not on file   Highest education level: Not on file  Occupational History   Not on file  Tobacco Use   Smoking status: Never    Passive exposure: Past   Smokeless tobacco: Never  Substance and Sexual Activity   Alcohol use: Yes    Alcohol/week: 4.0 standard drinks of alcohol    Types: 4 Glasses of wine per week   Drug use: No   Sexual activity: Yes  Other Topics Concern   Not on file  Social History Narrative   Not on file   Social Drivers of Health   Financial Resource Strain: Not on file  Food Insecurity: Not on file  Transportation Needs: Not on file  Physical Activity: Not on file  Stress: Not on file  Social Connections: Not on file  Intimate Partner Violence: Not on file    Review of Systems  Constitutional:  Negative for fatigue.  Respiratory:  Positive for shortness of breath. Negative for cough.     Vitals:   10/22/23 0833  BP: 133/82  Pulse: 61  SpO2: 98%     Physical Exam Constitutional:      Appearance: Normal appearance.  HENT:     Head: Normocephalic.     Mouth/Throat:     Mouth: Mucous membranes are moist.  Eyes:     General: No scleral icterus. Cardiovascular:     Rate and Rhythm: Normal rate and regular rhythm.     Heart sounds: No murmur heard.    No friction rub.  Pulmonary:     Effort: No respiratory distress.     Breath sounds: No stridor. No wheezing or rhonchi.  Musculoskeletal:     Cervical back: No rigidity or tenderness.  Neurological:     Mental Status: He is alert.  Psychiatric:        Mood and Affect: Mood normal.    Data Reviewed: Echocardiogram 2023-normal ejection fraction normal diastolic function -No significant abnormality  Last chest x-ray on record was  in 2017 with no significant abnormality noted-reviewed x-ray myself  Assessment:  Concern for exercise-induced asthma  Chronic dyspnea  History of hypertension -Blood pressure optimally controlled  Hyperlipidemia  History of GERD -Controlled symptoms    Plan/Recommendations: Schedule for chest x-ray  Schedule for PFT with methacholine challenge study  Schedule for echocardiogram  Prescribed albuterol to be used preexercise  Prescribed Symbicort to be used on a regular basis.  Okay but he  Instructed about how to use inhalers properly  Continue graded exercise as tolerated  Encourage weight loss measures   Follow-up appointment in 6 to  8 weeks  Virl Diamond MD [facility] 10/22/2023, 8:40 AM  CC: [name], MD

## 2023-10-23 ENCOUNTER — Ambulatory Visit (HOSPITAL_COMMUNITY): Attending: Pulmonary Disease

## 2023-10-23 DIAGNOSIS — R0600 Dyspnea, unspecified: Secondary | ICD-10-CM | POA: Diagnosis not present

## 2023-10-23 LAB — ECHOCARDIOGRAM COMPLETE
Area-P 1/2: 3.13 cm2
S' Lateral: 3 cm

## 2023-10-26 ENCOUNTER — Other Ambulatory Visit (HOSPITAL_COMMUNITY)
Admission: RE | Admit: 2023-10-26 | Discharge: 2023-10-26 | Disposition: A | Discharge status: Home / Self Care | Payer: Self-pay | Source: Ambulatory Visit | Attending: Medical Genetics | Admitting: Medical Genetics

## 2023-10-26 DIAGNOSIS — N1831 Chronic kidney disease, stage 3a: Secondary | ICD-10-CM | POA: Diagnosis not present

## 2023-10-30 DIAGNOSIS — R0602 Shortness of breath: Secondary | ICD-10-CM | POA: Diagnosis not present

## 2023-10-30 DIAGNOSIS — E7800 Pure hypercholesterolemia, unspecified: Secondary | ICD-10-CM | POA: Diagnosis not present

## 2023-10-30 DIAGNOSIS — N1831 Chronic kidney disease, stage 3a: Secondary | ICD-10-CM | POA: Diagnosis not present

## 2023-10-30 DIAGNOSIS — E039 Hypothyroidism, unspecified: Secondary | ICD-10-CM | POA: Diagnosis not present

## 2023-10-30 LAB — COMPREHENSIVE METABOLIC PANEL WITH GFR: EGFR: 51

## 2023-11-01 ENCOUNTER — Other Ambulatory Visit (HOSPITAL_COMMUNITY): Payer: Self-pay | Admitting: Family Medicine

## 2023-11-01 DIAGNOSIS — E7800 Pure hypercholesterolemia, unspecified: Secondary | ICD-10-CM

## 2023-11-02 ENCOUNTER — Ambulatory Visit: Attending: Internal Medicine | Admitting: Internal Medicine

## 2023-11-02 ENCOUNTER — Telehealth: Payer: Self-pay

## 2023-11-02 ENCOUNTER — Encounter: Payer: Self-pay | Admitting: Internal Medicine

## 2023-11-02 VITALS — BP 126/78 | HR 70 | Ht 67.0 in | Wt 194.6 lb

## 2023-11-02 DIAGNOSIS — R072 Precordial pain: Secondary | ICD-10-CM | POA: Insufficient documentation

## 2023-11-02 DIAGNOSIS — R0609 Other forms of dyspnea: Secondary | ICD-10-CM | POA: Diagnosis not present

## 2023-11-02 DIAGNOSIS — E785 Hyperlipidemia, unspecified: Secondary | ICD-10-CM | POA: Insufficient documentation

## 2023-11-02 DIAGNOSIS — Z6830 Body mass index (BMI) 30.0-30.9, adult: Secondary | ICD-10-CM | POA: Diagnosis not present

## 2023-11-02 DIAGNOSIS — I700 Atherosclerosis of aorta: Secondary | ICD-10-CM | POA: Insufficient documentation

## 2023-11-02 DIAGNOSIS — I10 Essential (primary) hypertension: Secondary | ICD-10-CM | POA: Diagnosis not present

## 2023-11-02 DIAGNOSIS — N1831 Chronic kidney disease, stage 3a: Secondary | ICD-10-CM | POA: Insufficient documentation

## 2023-11-02 MED ORDER — METOPROLOL TARTRATE 100 MG PO TABS: 100.0000 mg | ORAL_TABLET | Freq: Once | ORAL | 0 refills | Status: DC

## 2023-11-02 NOTE — Patient Instructions (Addendum)
 Medication Instructions:  No changes *If you need a refill on your cardiac medications before your next appointment, please call your pharmacy*  Lab Work: Bmet prior to ct scan   Testing/Procedures: Coronary CT Angiogram - see instructions below  Follow-Up: As needed  Other Instructions cCTA    Your cardiac CT will be scheduled at:   [facility] [address] [place], [place] [address] [phone]  Please arrive at the [facility] (Entrance C2) of [facility] 30 minutes prior to test start time.  You can use the FREE valet parking offered at entrance C (encouraged to control the heart rate for the test).  Proceed to the [facility] Radiology Department (first floor) to check-in and test prep.  All radiology patients and guests should use entrance C2 at [facility], accessed from [facility], even though the hospital's physical address listed is [address].   Please follow these instructions carefully (unless otherwise directed):  An IV will be required for this test and Nitroglycerin will be given.  Hold all erectile dysfunction medications at least 3 days (72 hrs) prior to test. (Ie viagra, cialis, sildenafil, tadalafil, etc)   On the Night Before the Test: Be sure to Drink plenty of water. Do not consume any caffeinated/decaffeinated beverages or chocolate 12 hours prior to your test. Do not take any antihistamines 12 hours prior to your test.  On the Day of the Test: Drink plenty of water until 1 hour prior to the test. Do not eat any food 1 hour prior to test. You may take your regular medications prior to the test.  Take metoprolol (Lopressor) two hours prior to test. If you take Furosemide/Hydrochlorothiazide/Spironolactone/Chlorthalidone, please HOLD on the morning of the test. Patients who wear a continuous glucose monitor MUST remove the device prior to scanning.      After the  Test: Drink plenty of water. After receiving IV contrast, you may experience a mild flushed feeling. This is normal. On occasion, you may experience a mild rash up to 24 hours after the test. This is not dangerous. If this occurs, you can take Benadryl 25 mg, Zyrtec, Claritin, or Allegra and increase your fluid intake. (Patients taking Tikosyn should avoid Benadryl, and may take Zyrtec, Claritin, or Allegra) If you experience trouble breathing, this can be serious. If it is severe call 911 IMMEDIATELY. If it is mild, please call our office.  We will call to schedule your test 2-4 weeks out understanding that some insurance companies will need an authorization prior to the service being performed.   For more information and frequently asked questions, please visit our website : [email or web address]  For non-scheduling related questions, please contact the cardiac imaging nurse navigator should you have any questions/concerns: Cardiac Imaging Nurse Navigators Direct Office Dial: [phone]   For scheduling needs, including cancellations and rescheduling, please call [place], [phone].       1st Floor: - Lobby - Registration  - Pharmacy  - Lab - Cafe  2nd Floor: - PV Lab - Diagnostic Testing (echo, CT, nuclear med)  3rd Floor: - Vacant  4th Floor: - TCTS (cardiothoracic surgery) - AFib Clinic - Structural Heart Clinic - Vascular Surgery  - Vascular Ultrasound  5th Floor: - HeartCare Cardiology (general and EP) - Clinical Pharmacy for coumadin, hypertension, lipid, weight-loss medications, and med management appointments    Valet parking services will be available as well.

## 2023-11-02 NOTE — Progress Notes (Signed)
 Cardiology Office Note:   Date:  11/02/2023  ID:  Derrick Green, DOB 01/03/1956, MRN [id number] PCP:  [name], MD  [place] [facility] Providers Cardiologist:  [name], MD Referring MD: [name], MD  Chief Complaint/Reason for Referral: Dyspnea ASSESSMENT:    1. Dyspnea on exertion   2. Primary hypertension   3. Hyperlipidemia LDL goal <70   4. Aortic atherosclerosis (HCC)   5. Stage 3a chronic kidney disease (HCC)   6. BMI 30.0-30.9,adult   7. Precordial pain     PLAN:   In order of problems listed above: Dyspnea on exertion:  We will obtain a coronary CTA to evaluate further.  If the patient has mild obstructive coronary artery disease, they will require a statin (with goal LDL < 70) and aspirin, if they have high-grade disease we will need to consider optimal medical therapy and if symptoms are refractory to medical therapy, then a cardiac catheterization with possible PCI will be pursued to alleviate symptoms.  If they have high risk disease we will proceed directly to cardiac catheterization.  If this evaluation is negative then his symptoms may be from deconditioning and weight gain over the last few years. Hypertension: Patient is currently on amlodipine 2.5 mg and hydrochlorothiazide 25.  Given history of CKD stage III, may be better served with an ARB. Hyperlipidemia: Patient's LDL recently was at 102.  His goal LDL is less than 70.  Aortic atherosclerosis: By virtue of the presence of aortic atherosclerosis patient's LDL goal is less than 70. CKD stage IIIa: Consider ARB for renal protection. Elevated BMI: Continue diet and exercise            Dispo:  Return if symptoms worsen or fail to improve.      Medication Adjustments/Labs and Tests Ordered: Current medicines are reviewed at length with the patient today.  Concerns regarding medicines are outlined above.  The following changes have been made:  no change   Labs/tests ordered: Orders Placed This  Encounter  Procedures   CT CORONARY MORPH W/CTA COR W/SCORE W/CA W/CM &/OR WO/CM   EKG 12-Lead    Medication Changes: Meds ordered this encounter  Medications   metoprolol tartrate (LOPRESSOR) 100 MG tablet    Sig: Take 1 tablet (100 mg total) by mouth once for 1 dose. Take 90-120 minutes prior to scan. Hold for SBP less than 110.    Dispense:  1 tablet    Refill:  0    Current medicines are reviewed at length with the patient today.  The patient does not have concerns regarding medicines.     History of Present Illness:      FOCUSED PROBLEM LIST:   Asthma, moderate persistent Hypertension Hyperlipidemia  Aortic atherosclerosis CT abdomen pelvis 2021 CKD stage IIIa BMI 04 November 2023:  Patient consents to use of AI scribe. The patient is a 68 year old male with the above listed medical problems referred for recommendations regarding dyspnea.  The patient has had a longstanding dyspnea for some time.  He has a history of moderate persistent asthma.  Because of concerns of a possible cardiac etiology he is referred for further recommendations.  He was seen by pulmonology for an echocardiogram and PFTs.  His echocardiogram demonstrated preserved LV function with no significant valvular abnormalities.  He experiences shortness of breath during exertion, which has progressively worsened over the past three to four years. Initially, he could run sixty yards repeatedly without issue, but now he experiences significant shortness of  breath after such exertion. The shortness of breath occurs within ten to fifteen seconds of starting activities like running, swimming, or walking uphill. No chest pain, leg swelling, or blacking out spells during these activities, although he feels lightheaded after intense swimming sessions and needs to rest after exiting the pool.  He was diagnosed with exercise-induced asthma approximately ten to twelve years ago and was prescribed an inhaler, which did not  significantly alleviate his symptoms. A steroid inhaler was later added, which helped with wheezing and a 'bubbling' sensation in his lungs, but the shortness of breath persisted. He can walk three miles three times a day without issue, but experiences symptoms when exerting himself more intensely.  He underwent shoulder surgery in May of the previous year, which limited his ability to exercise. Before the surgery, he swam a thousand yards three days a week, resting for one and a half to two minutes after every fifty yards. Post-surgery, he resumed swimming in September, but now requires four minutes of rest after fifty yards, indicating a decline in his exercise tolerance.  He has gained about 20 pounds over the last couple of years as well.  His past medical history includes high blood pressure, high cholesterol, and mild kidney dysfunction. He is currently on hydrochlorothiazide 25 mg for hypertension. He has gained approximately twenty pounds since his surgery, which he attributes to decreased physical activity.          Current Medications: Current Meds  Medication Sig   albuterol (VENTOLIN HFA) 108 (90 Base) MCG/ACT inhaler Inhale 2 puffs into the lungs every 6 (six) hours as needed for wheezing or shortness of breath.   amLODipine (NORVASC) 2.5 MG tablet Take 2.5 mg by mouth daily.   atorvastatin (LIPITOR) 40 MG tablet Take 40 mg by mouth daily.   budesonide-formoterol (SYMBICORT) 160-4.5 MCG/ACT inhaler Inhale 2 puffs into the lungs 2 (two) times daily.   hydrochlorothiazide (HYDRODIURIL) 25 MG tablet Take 25 mg by mouth daily.   imiquimod (ALDARA) 5 % cream SMARTSIG:1 sparingly Topical Every Night   levothyroxine (SYNTHROID) 137 MCG tablet TAKE 1 TABLET BY MOUTH EVERY DAY IN THE MORNING ON AN EMPTY STOMACH for 90   loratadine (CLARITIN) 10 MG tablet Take 10 mg by mouth daily as needed for allergies.   metoprolol tartrate (LOPRESSOR) 100 MG tablet Take 1 tablet (100 mg total) by mouth  once for 1 dose. Take 90-120 minutes prior to scan. Hold for SBP less than 110.   Omega-3 Fatty Acids (FISH OIL) 1000 MG CAPS 1 tablet Orally twice a day   Tafluprost, PF, 0.0015 % SOLN Place 1 drop into both eyes at bedtime.   tamsulosin (FLOMAX) 0.4 MG CAPS capsule Take 0.4 mg by mouth.     Review of Systems:   Please see the history of present illness.    All other systems reviewed and are negative.     EKGs/Labs/Other Test Reviewed:   EKG:    EKG Interpretation Date/Time:  Friday November 02 2023 16:05:32 EDT Ventricular Rate:  70 PR Interval:  154 QRS Duration:  90 QT Interval:  440 QTC Calculation: 475 R Axis:   -21  Text Interpretation: Normal sinus rhythm Normal ECG When compared with ECG of 27-Dec-2005 15:38, No significant change was found Confirmed by [name] ([id number]) on 11/02/2023 4:17:54 PM         Risk Assessment/Calculations:          Physical Exam:   VS:  BP 126/78   Pulse  70   Ht 5\' 7"  (1.702 m)   Wt 194 lb 9.6 oz (88.3 kg)   SpO2 96%   BMI 30.48 kg/m        Wt Readings from Last 3 Encounters:  11/02/23 194 lb 9.6 oz (88.3 kg)  10/22/23 196 lb 6.4 oz (89.1 kg)  11/15/15 182 lb (82.6 kg)      GENERAL:  No apparent distress, AOx3 HEENT:  No carotid bruits, +2 carotid impulses, no scleral icterus CAR: RRR  no murmurs, gallops, rubs, or thrills RES:  Clear to auscultation bilaterally ABD:  Soft, nontender, nondistended, positive bowel sounds x 4 VASC:  +2 radial pulses, +2 carotid pulses NEURO:  CN 2-12 grossly intact; motor and sensory grossly intact PSYCH:  No active depression or anxiety EXT:  No edema, ecchymosis, or cyanosis  Signed, Orbie Pyo, MD  11/02/2023 5:18 PM    [facility] [address] [place], [place], [place]  [address] Phone: [phone]; Fax: [phone]   Note:  This document was prepared using Dragon voice recognition software and may include unintentional dictation errors.

## 2023-11-02 NOTE — Telephone Encounter (Signed)
 Patient needs updated BMET prior to coronary CTA.  Patient states he had this drawn at PCP's office last week and asked for our office to request these records rather than having more labs drawn.  Called and left message with [facility]'s medical records department requesting recent BMET or CMET to be faxed to our office at [phone].  Provided office number for callback if any questions or concerns.

## 2023-11-05 DIAGNOSIS — N1831 Chronic kidney disease, stage 3a: Secondary | ICD-10-CM | POA: Diagnosis not present

## 2023-11-05 DIAGNOSIS — E7800 Pure hypercholesterolemia, unspecified: Secondary | ICD-10-CM | POA: Diagnosis not present

## 2023-11-05 DIAGNOSIS — E039 Hypothyroidism, unspecified: Secondary | ICD-10-CM | POA: Diagnosis not present

## 2023-11-05 LAB — GENECONNECT MOLECULAR SCREEN: Genetic Analysis Overall Interpretation: NEGATIVE

## 2023-11-08 NOTE — Telephone Encounter (Signed)
 Labs from PCP collected 10/30/23 GFR 51, labs are scanned into chart under media tab.

## 2023-11-13 ENCOUNTER — Encounter (HOSPITAL_COMMUNITY): Payer: Self-pay

## 2023-11-15 ENCOUNTER — Ambulatory Visit (HOSPITAL_COMMUNITY)
Admission: RE | Admit: 2023-11-15 | Discharge: 2023-11-15 | Disposition: A | Discharge status: Home / Self Care | Source: Ambulatory Visit | Attending: Internal Medicine | Admitting: Internal Medicine

## 2023-11-15 ENCOUNTER — Ambulatory Visit (HOSPITAL_COMMUNITY)
Admission: RE | Admit: 2023-11-15 | Discharge: 2023-11-15 | Disposition: A | Discharge status: Home / Self Care | Source: Ambulatory Visit | Attending: Cardiovascular Disease

## 2023-11-15 ENCOUNTER — Other Ambulatory Visit: Payer: Self-pay | Admitting: Cardiovascular Disease

## 2023-11-15 DIAGNOSIS — R931 Abnormal findings on diagnostic imaging of heart and coronary circulation: Secondary | ICD-10-CM | POA: Diagnosis not present

## 2023-11-15 DIAGNOSIS — I2510 Atherosclerotic heart disease of native coronary artery without angina pectoris: Secondary | ICD-10-CM

## 2023-11-15 DIAGNOSIS — R072 Precordial pain: Secondary | ICD-10-CM | POA: Diagnosis not present

## 2023-11-15 DIAGNOSIS — R0609 Other forms of dyspnea: Secondary | ICD-10-CM | POA: Diagnosis not present

## 2023-11-15 MED ORDER — NITROGLYCERIN 0.4 MG SL SUBL
SUBLINGUAL_TABLET | SUBLINGUAL | Status: AC
  Filled 2023-11-15: qty 2

## 2023-11-15 MED ORDER — NITROGLYCERIN 0.4 MG SL SUBL
0.8000 mg | SUBLINGUAL_TABLET | Freq: Once | SUBLINGUAL | Status: AC
  Administered 2023-11-15: 0.8 mg via SUBLINGUAL

## 2023-11-15 MED ORDER — IOHEXOL 350 MG/ML SOLN
95.0000 mL | Freq: Once | INTRAVENOUS | Status: AC | PRN
  Administered 2023-11-15: 95 mL via INTRAVENOUS

## 2023-11-15 NOTE — Progress Notes (Signed)
 CT FFR ordered.  Gerri Spore T. Flora Lipps, MD, [facility]  [facility]  [address] [place], [place] [address] [phone]  9:33 PM

## 2023-11-16 ENCOUNTER — Encounter: Payer: Self-pay | Admitting: Internal Medicine

## 2023-11-22 MED ORDER — ASPIRIN 81 MG PO TBEC: 81.0000 mg | DELAYED_RELEASE_TABLET | Freq: Every day | ORAL | Status: AC

## 2023-12-05 DIAGNOSIS — N1831 Chronic kidney disease, stage 3a: Secondary | ICD-10-CM | POA: Diagnosis not present

## 2023-12-05 DIAGNOSIS — E039 Hypothyroidism, unspecified: Secondary | ICD-10-CM | POA: Diagnosis not present

## 2023-12-05 DIAGNOSIS — E7800 Pure hypercholesterolemia, unspecified: Secondary | ICD-10-CM | POA: Diagnosis not present

## 2023-12-18 ENCOUNTER — Ambulatory Visit: Admitting: Pulmonary Disease

## 2023-12-18 DIAGNOSIS — R0600 Dyspnea, unspecified: Secondary | ICD-10-CM

## 2023-12-18 LAB — PULMONARY FUNCTION TEST
DL/VA % pred: 107 %
DL/VA: 4.39 ml/min/mmHg/L
DLCO cor % pred: 106 %
DLCO cor: 25.04 ml/min/mmHg
DLCO unc % pred: 106 %
DLCO unc: 25.04 ml/min/mmHg
FEF 25-75 Post: 2.53 L/s
FEF 25-75 Pre: 1.73 L/s
FEF2575-%Change-Post: 46 %
FEF2575-%Pred-Post: 114 %
FEF2575-%Pred-Pre: 78 %
FEV1-%Change-Post: 12 %
FEV1-%Pred-Post: 100 %
FEV1-%Pred-Pre: 89 %
FEV1-Post: 2.88 L
FEV1-Pre: 2.57 L
FEV1FVC-%Change-Post: 4 %
FEV1FVC-%Pred-Pre: 96 %
FEV6-%Change-Post: 8 %
FEV6-%Pred-Post: 105 %
FEV6-%Pred-Pre: 97 %
FEV6-Post: 3.89 L
FEV6-Pre: 3.59 L
FEV6FVC-%Change-Post: 0 %
FEV6FVC-%Pred-Post: 106 %
FEV6FVC-%Pred-Pre: 105 %
FVC-%Change-Post: 7 %
FVC-%Pred-Post: 98 %
FVC-%Pred-Pre: 91 %
FVC-Post: 3.89 L
FVC-Pre: 3.61 L
Post FEV1/FVC ratio: 74 %
Post FEV6/FVC ratio: 100 %
Pre FEV1/FVC ratio: 71 %
Pre FEV6/FVC Ratio: 100 %
RV % pred: 103 %
RV: 2.36 L
TLC % pred: 96 %
TLC: 6.18 L

## 2023-12-18 NOTE — Progress Notes (Signed)
 Full PFT performed today.

## 2023-12-18 NOTE — Patient Instructions (Signed)
 Full PFT performed today.

## 2023-12-23 DIAGNOSIS — S86819A Strain of other muscle(s) and tendon(s) at lower leg level, unspecified leg, initial encounter: Secondary | ICD-10-CM | POA: Diagnosis not present

## 2023-12-24 ENCOUNTER — Ambulatory Visit: Admitting: Pulmonary Disease

## 2023-12-24 ENCOUNTER — Encounter: Payer: Self-pay | Admitting: Pulmonary Disease

## 2023-12-24 VITALS — BP 110/60 | HR 63 | Ht 67.0 in | Wt 199.6 lb

## 2023-12-24 DIAGNOSIS — R942 Abnormal results of pulmonary function studies: Secondary | ICD-10-CM

## 2023-12-24 DIAGNOSIS — R0600 Dyspnea, unspecified: Secondary | ICD-10-CM

## 2023-12-24 DIAGNOSIS — J45901 Unspecified asthma with (acute) exacerbation: Secondary | ICD-10-CM

## 2023-12-24 NOTE — Progress Notes (Signed)
 Derrick Green    [id number]    [birth year]  Primary Care Physician:Koirala, Dibas, MD  Referring Physician: Lanae Pinal, MD [address] [place],  [place] [address]  Chief complaint:    Patient being seen for shortness of breath HPI:  Was seen for progressive shortness of breath Symptoms have improved since her last visit Currently using Symbicort  twice a day and albuterol  before activity which usually is about 3 days a week of swimming  He usually does have to take breaks following cerumen some labs  Has had chronic shortness of breath over 10 years but symptoms a little bit better since her last visit  Was very active when he was much younger with running, had to stop running with his shortness of breath  He is feeling better with cerumen but still has some difficulty  Shoulder surgery May of last year, has not been able to rebound from that  He tries to stay active, walks about 3 miles 3 times a week, he walks without difficulty Walks his dog on a regular basis as well  No significant limitation if he is walking on level ground  Denies any significant wheezing, denies any chest pain or chest discomfort  Family history of asthma with his dad and sister  Never smoked, worked in an office environment  Weight gain of about 20 pounds, symptoms of worsening shortness of breath started before the weight gain.  Does have symptoms of acid reflux, controlled  Outpatient Encounter Medications as of 12/24/2023  Medication Sig   albuterol  (VENTOLIN  HFA) 108 (90 Base) MCG/ACT inhaler Inhale 2 puffs into the lungs every 6 (six) hours as needed for wheezing or shortness of breath.   amLODipine (NORVASC) 2.5 MG tablet Take 2.5 mg by mouth daily.   aspirin  EC 81 MG tablet Take 1 tablet (81 mg total) by mouth daily. Swallow whole.   atorvastatin (LIPITOR) 40 MG tablet Take 40 mg by mouth daily.   budesonide -formoterol  (SYMBICORT ) 160-4.5 MCG/ACT  inhaler Inhale 2 puffs into the lungs 2 (two) times daily.   hydrochlorothiazide (HYDRODIURIL) 25 MG tablet Take 25 mg by mouth daily.   levothyroxine  (SYNTHROID ) 137 MCG tablet TAKE 1 TABLET BY MOUTH EVERY DAY IN THE MORNING ON AN EMPTY STOMACH for 90   loratadine (CLARITIN) 10 MG tablet Take 10 mg by mouth daily as needed for allergies.   Omega-3 Fatty Acids (FISH OIL) 1000 MG CAPS 1 tablet Orally twice a day   Tafluprost, PF, 0.0015 % SOLN Place 1 drop into both eyes at bedtime.   tamsulosin (FLOMAX) 0.4 MG CAPS capsule Take 0.4 mg by mouth.   [DISCONTINUED] imiquimod (ALDARA) 5 % cream SMARTSIG:1 sparingly Topical Every Night (Patient not taking: Reported on 12/24/2023)   [DISCONTINUED] metoprolol  tartrate (LOPRESSOR ) 100 MG tablet Take 1 tablet (100 mg total) by mouth once for 1 dose. Take 90-120 minutes prior to scan. Hold for SBP less than 110.   No facility-administered encounter medications on file as of 12/24/2023.    Allergies as of 12/24/2023 - Review Complete 12/24/2023  Allergen Reaction Noted   Penicillin g Anaphylaxis 11/05/2015   Morphine and codeine Other (See Comments) 11/09/2015    Past Medical History:  Diagnosis Date   Exercise-induced asthma    GERD (gastroesophageal reflux disease)    in past, not currently   Head injury due to trauma 1971; 1972   playing football in high school   High cholesterol  Hypothyroidism    Pneumonia 1960s X 1   Situational depression 2009   "a little after my wife died"   Thyroid  mass     Past Surgical History:  Procedure Laterality Date   COLONOSCOPY W/ BIOPSIES AND POLYPECTOMY  2017   SHOULDER ARTHROSCOPY Right    THYROIDECTOMY N/A 11/15/2015   Procedure: TOTAL THYROIDECTOMY;  Surgeon: Oralee Billow, MD;  Location: [facility] OR;  Service: General;  Laterality: N/A;   TONSILLECTOMY  1959   TOTAL THYROIDECTOMY  11/15/2015   vocal fold  ~ 2007   "repairing vocal cord; 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  Social  History   Socioeconomic History   Marital status: Married    Spouse name: Not on file   Number of children: Not on file   Years of education: Not on file   Highest education level: Not on file  Occupational History   Not on file  Tobacco Use   Smoking status: Never    Passive exposure: Past   Smokeless tobacco: Never  Substance and Sexual Activity   Alcohol use: Yes    Alcohol/week: 4.0 standard drinks of alcohol    Types: 4 Glasses of wine per week   Drug use: No   Sexual activity: Yes  Other Topics Concern   Not on file  Social History Narrative   Not on file   Social Drivers of Health   Financial Resource Strain: Not on file  Food Insecurity: Not on file  Transportation Needs: Not on file  Physical Activity: Not on file  Stress: Not on file  Social Connections: Not on file  Intimate Partner Violence: Not on file    Review of Systems  Constitutional:  Negative for fatigue.  Respiratory:  Positive for shortness of breath. Negative for cough.     Vitals:   12/24/23 1358  BP: 110/60  Pulse: 63  SpO2: 98%     Physical Exam Constitutional:      Appearance: Normal appearance.  HENT:     Head: Normocephalic.     Mouth/Throat:     Mouth: Mucous membranes are moist.  Eyes:     General: No scleral icterus. Cardiovascular:     Rate and Rhythm: Normal rate and regular rhythm.     Heart sounds: No murmur heard.    No friction rub.  Pulmonary:     Effort: No respiratory distress.     Breath sounds: No stridor. No wheezing or rhonchi.  Musculoskeletal:     Cervical back: No rigidity or tenderness.  Neurological:     Mental Status: He is alert.  Psychiatric:        Mood and Affect: Mood normal.    Data Reviewed: Echocardiogram 2023-normal ejection fraction normal diastolic function -No significant abnormality  Last chest x-ray on record was in 2017 with no significant abnormality noted-reviewed x-ray myself  Pulmonary function test reviewed-no  significant obstruction, there is significant bronchodilator response, no restriction, normal diffusing capacity  Assessment:  Likely exercise-induced asthma  Airway hyperresponsiveness on PFT  History of hypertension - Optimally controlled  History of GERD Symptoms are controlled  Plan/Recommendations: Continue current inhaler use including Symbicort  and albuterol   Encouraged to wean off using albuterol  prior to his activity  At some point can also cut down on use of Symbicort  to as needed  If he requires Symbicort  on a regular basis to maintain activity levels, encouraged to continue the same  Encouraged to ensure that he stays active  Follow-up in about 4 to 5 months   Myer Artis MD [facility] 12/24/2023, 2:12 PM  CC: Lanae Pinal, MD

## 2023-12-24 NOTE — Patient Instructions (Addendum)
 I will see you back in about 4 to 5 months  Continue using your inhalers  Try and scale back on inhaler use as she can tolerate as long as you are not coming down on what you are able to do on a regular basis  The ultrasound of the heart looks normal  Chest x-ray looks normal  The breathing study does show a hyperactivity which the inhalers will help with  Call us  with significant concerns

## 2023-12-27 DIAGNOSIS — H401131 Primary open-angle glaucoma, bilateral, mild stage: Secondary | ICD-10-CM | POA: Diagnosis not present

## 2023-12-31 DIAGNOSIS — S86112D Strain of other muscle(s) and tendon(s) of posterior muscle group at lower leg level, left leg, subsequent encounter: Secondary | ICD-10-CM | POA: Diagnosis not present

## 2024-01-03 DIAGNOSIS — N401 Enlarged prostate with lower urinary tract symptoms: Secondary | ICD-10-CM | POA: Diagnosis not present

## 2024-01-05 DIAGNOSIS — E039 Hypothyroidism, unspecified: Secondary | ICD-10-CM | POA: Diagnosis not present

## 2024-01-05 DIAGNOSIS — N1831 Chronic kidney disease, stage 3a: Secondary | ICD-10-CM | POA: Diagnosis not present

## 2024-01-05 DIAGNOSIS — E7800 Pure hypercholesterolemia, unspecified: Secondary | ICD-10-CM | POA: Diagnosis not present

## 2024-01-08 DIAGNOSIS — N1831 Chronic kidney disease, stage 3a: Secondary | ICD-10-CM | POA: Diagnosis not present

## 2024-01-10 DIAGNOSIS — N528 Other male erectile dysfunction: Secondary | ICD-10-CM | POA: Diagnosis not present

## 2024-01-10 DIAGNOSIS — R351 Nocturia: Secondary | ICD-10-CM | POA: Diagnosis not present

## 2024-01-10 DIAGNOSIS — N401 Enlarged prostate with lower urinary tract symptoms: Secondary | ICD-10-CM | POA: Diagnosis not present

## 2024-01-10 DIAGNOSIS — R3912 Poor urinary stream: Secondary | ICD-10-CM | POA: Diagnosis not present

## 2024-01-15 ENCOUNTER — Other Ambulatory Visit: Payer: Self-pay | Admitting: Urology

## 2024-01-15 DIAGNOSIS — N1831 Chronic kidney disease, stage 3a: Secondary | ICD-10-CM

## 2024-01-18 ENCOUNTER — Ambulatory Visit
Admission: RE | Admit: 2024-01-18 | Discharge: 2024-01-18 | Disposition: A | Discharge status: Home / Self Care | Source: Ambulatory Visit | Attending: Urology | Admitting: Urology

## 2024-01-18 DIAGNOSIS — N1831 Chronic kidney disease, stage 3a: Secondary | ICD-10-CM

## 2024-01-18 DIAGNOSIS — N183 Chronic kidney disease, stage 3 unspecified: Secondary | ICD-10-CM | POA: Diagnosis not present

## 2024-01-18 DIAGNOSIS — I10 Essential (primary) hypertension: Secondary | ICD-10-CM | POA: Diagnosis not present

## 2024-01-31 ENCOUNTER — Ambulatory Visit: Admitting: Internal Medicine

## 2024-02-21 DIAGNOSIS — E7800 Pure hypercholesterolemia, unspecified: Secondary | ICD-10-CM | POA: Diagnosis not present

## 2024-02-21 DIAGNOSIS — N1831 Chronic kidney disease, stage 3a: Secondary | ICD-10-CM | POA: Diagnosis not present

## 2024-02-25 DIAGNOSIS — M9902 Segmental and somatic dysfunction of thoracic region: Secondary | ICD-10-CM | POA: Diagnosis not present

## 2024-02-25 DIAGNOSIS — M9903 Segmental and somatic dysfunction of lumbar region: Secondary | ICD-10-CM | POA: Diagnosis not present

## 2024-02-26 DIAGNOSIS — M9903 Segmental and somatic dysfunction of lumbar region: Secondary | ICD-10-CM | POA: Diagnosis not present

## 2024-02-26 DIAGNOSIS — M9902 Segmental and somatic dysfunction of thoracic region: Secondary | ICD-10-CM | POA: Diagnosis not present

## 2024-02-28 DIAGNOSIS — D485 Neoplasm of uncertain behavior of skin: Secondary | ICD-10-CM | POA: Diagnosis not present

## 2024-02-28 DIAGNOSIS — D044 Carcinoma in situ of skin of scalp and neck: Secondary | ICD-10-CM | POA: Diagnosis not present

## 2024-02-28 DIAGNOSIS — B079 Viral wart, unspecified: Secondary | ICD-10-CM | POA: Diagnosis not present

## 2024-02-28 DIAGNOSIS — L814 Other melanin hyperpigmentation: Secondary | ICD-10-CM | POA: Diagnosis not present

## 2024-02-28 DIAGNOSIS — L821 Other seborrheic keratosis: Secondary | ICD-10-CM | POA: Diagnosis not present

## 2024-02-28 DIAGNOSIS — L570 Actinic keratosis: Secondary | ICD-10-CM | POA: Diagnosis not present

## 2024-02-28 DIAGNOSIS — B078 Other viral warts: Secondary | ICD-10-CM | POA: Diagnosis not present

## 2024-02-28 DIAGNOSIS — D225 Melanocytic nevi of trunk: Secondary | ICD-10-CM | POA: Diagnosis not present

## 2024-02-28 DIAGNOSIS — M9903 Segmental and somatic dysfunction of lumbar region: Secondary | ICD-10-CM | POA: Diagnosis not present

## 2024-02-28 DIAGNOSIS — M9902 Segmental and somatic dysfunction of thoracic region: Secondary | ICD-10-CM | POA: Diagnosis not present

## 2024-03-04 DIAGNOSIS — M9903 Segmental and somatic dysfunction of lumbar region: Secondary | ICD-10-CM | POA: Diagnosis not present

## 2024-03-04 DIAGNOSIS — M9902 Segmental and somatic dysfunction of thoracic region: Secondary | ICD-10-CM | POA: Diagnosis not present

## 2024-03-06 DIAGNOSIS — E039 Hypothyroidism, unspecified: Secondary | ICD-10-CM | POA: Diagnosis not present

## 2024-03-06 DIAGNOSIS — E7800 Pure hypercholesterolemia, unspecified: Secondary | ICD-10-CM | POA: Diagnosis not present

## 2024-03-06 DIAGNOSIS — M9902 Segmental and somatic dysfunction of thoracic region: Secondary | ICD-10-CM | POA: Diagnosis not present

## 2024-03-06 DIAGNOSIS — N1831 Chronic kidney disease, stage 3a: Secondary | ICD-10-CM | POA: Diagnosis not present

## 2024-03-06 DIAGNOSIS — M9903 Segmental and somatic dysfunction of lumbar region: Secondary | ICD-10-CM | POA: Diagnosis not present

## 2024-03-11 DIAGNOSIS — M9902 Segmental and somatic dysfunction of thoracic region: Secondary | ICD-10-CM | POA: Diagnosis not present

## 2024-03-11 DIAGNOSIS — M9903 Segmental and somatic dysfunction of lumbar region: Secondary | ICD-10-CM | POA: Diagnosis not present

## 2024-03-13 DIAGNOSIS — M9903 Segmental and somatic dysfunction of lumbar region: Secondary | ICD-10-CM | POA: Diagnosis not present

## 2024-03-13 DIAGNOSIS — M9902 Segmental and somatic dysfunction of thoracic region: Secondary | ICD-10-CM | POA: Diagnosis not present

## 2024-03-18 DIAGNOSIS — M9902 Segmental and somatic dysfunction of thoracic region: Secondary | ICD-10-CM | POA: Diagnosis not present

## 2024-03-18 DIAGNOSIS — M9903 Segmental and somatic dysfunction of lumbar region: Secondary | ICD-10-CM | POA: Diagnosis not present

## 2024-03-20 DIAGNOSIS — M9903 Segmental and somatic dysfunction of lumbar region: Secondary | ICD-10-CM | POA: Diagnosis not present

## 2024-03-20 DIAGNOSIS — M9902 Segmental and somatic dysfunction of thoracic region: Secondary | ICD-10-CM | POA: Diagnosis not present

## 2024-03-25 DIAGNOSIS — M9903 Segmental and somatic dysfunction of lumbar region: Secondary | ICD-10-CM | POA: Diagnosis not present

## 2024-03-25 DIAGNOSIS — M9902 Segmental and somatic dysfunction of thoracic region: Secondary | ICD-10-CM | POA: Diagnosis not present

## 2024-03-27 DIAGNOSIS — M9903 Segmental and somatic dysfunction of lumbar region: Secondary | ICD-10-CM | POA: Diagnosis not present

## 2024-03-27 DIAGNOSIS — M9902 Segmental and somatic dysfunction of thoracic region: Secondary | ICD-10-CM | POA: Diagnosis not present

## 2024-04-01 DIAGNOSIS — L255 Unspecified contact dermatitis due to plants, except food: Secondary | ICD-10-CM | POA: Diagnosis not present

## 2024-04-03 DIAGNOSIS — M9903 Segmental and somatic dysfunction of lumbar region: Secondary | ICD-10-CM | POA: Diagnosis not present

## 2024-04-03 DIAGNOSIS — M9902 Segmental and somatic dysfunction of thoracic region: Secondary | ICD-10-CM | POA: Diagnosis not present

## 2024-04-06 DIAGNOSIS — E7800 Pure hypercholesterolemia, unspecified: Secondary | ICD-10-CM | POA: Diagnosis not present

## 2024-04-06 DIAGNOSIS — N1831 Chronic kidney disease, stage 3a: Secondary | ICD-10-CM | POA: Diagnosis not present

## 2024-04-06 DIAGNOSIS — E039 Hypothyroidism, unspecified: Secondary | ICD-10-CM | POA: Diagnosis not present

## 2024-04-10 DIAGNOSIS — M9903 Segmental and somatic dysfunction of lumbar region: Secondary | ICD-10-CM | POA: Diagnosis not present

## 2024-04-10 DIAGNOSIS — M9902 Segmental and somatic dysfunction of thoracic region: Secondary | ICD-10-CM | POA: Diagnosis not present

## 2024-04-17 DIAGNOSIS — M9902 Segmental and somatic dysfunction of thoracic region: Secondary | ICD-10-CM | POA: Diagnosis not present

## 2024-04-17 DIAGNOSIS — M9903 Segmental and somatic dysfunction of lumbar region: Secondary | ICD-10-CM | POA: Diagnosis not present

## 2024-04-21 DIAGNOSIS — Z23 Encounter for immunization: Secondary | ICD-10-CM | POA: Diagnosis not present

## 2024-04-24 DIAGNOSIS — M9903 Segmental and somatic dysfunction of lumbar region: Secondary | ICD-10-CM | POA: Diagnosis not present

## 2024-04-24 DIAGNOSIS — M9902 Segmental and somatic dysfunction of thoracic region: Secondary | ICD-10-CM | POA: Diagnosis not present

## 2024-05-01 DIAGNOSIS — M9903 Segmental and somatic dysfunction of lumbar region: Secondary | ICD-10-CM | POA: Diagnosis not present

## 2024-05-01 DIAGNOSIS — M9902 Segmental and somatic dysfunction of thoracic region: Secondary | ICD-10-CM | POA: Diagnosis not present

## 2024-05-06 DIAGNOSIS — E7800 Pure hypercholesterolemia, unspecified: Secondary | ICD-10-CM | POA: Diagnosis not present

## 2024-05-06 DIAGNOSIS — N1831 Chronic kidney disease, stage 3a: Secondary | ICD-10-CM | POA: Diagnosis not present

## 2024-05-06 DIAGNOSIS — E039 Hypothyroidism, unspecified: Secondary | ICD-10-CM | POA: Diagnosis not present

## 2024-05-20 DIAGNOSIS — M9902 Segmental and somatic dysfunction of thoracic region: Secondary | ICD-10-CM | POA: Diagnosis not present

## 2024-05-20 DIAGNOSIS — M9903 Segmental and somatic dysfunction of lumbar region: Secondary | ICD-10-CM | POA: Diagnosis not present

## 2024-06-03 DIAGNOSIS — M9903 Segmental and somatic dysfunction of lumbar region: Secondary | ICD-10-CM | POA: Diagnosis not present

## 2024-06-03 DIAGNOSIS — M9902 Segmental and somatic dysfunction of thoracic region: Secondary | ICD-10-CM | POA: Diagnosis not present

## 2024-06-06 DIAGNOSIS — Z23 Encounter for immunization: Secondary | ICD-10-CM | POA: Diagnosis not present

## 2024-06-06 DIAGNOSIS — E039 Hypothyroidism, unspecified: Secondary | ICD-10-CM | POA: Diagnosis not present

## 2024-06-06 DIAGNOSIS — N1831 Chronic kidney disease, stage 3a: Secondary | ICD-10-CM | POA: Diagnosis not present

## 2024-06-06 DIAGNOSIS — E7800 Pure hypercholesterolemia, unspecified: Secondary | ICD-10-CM | POA: Diagnosis not present

## 2024-06-13 DIAGNOSIS — N401 Enlarged prostate with lower urinary tract symptoms: Secondary | ICD-10-CM | POA: Diagnosis not present

## 2024-06-13 DIAGNOSIS — R3912 Poor urinary stream: Secondary | ICD-10-CM | POA: Diagnosis not present

## 2024-06-17 DIAGNOSIS — M9902 Segmental and somatic dysfunction of thoracic region: Secondary | ICD-10-CM | POA: Diagnosis not present

## 2024-06-17 DIAGNOSIS — M9903 Segmental and somatic dysfunction of lumbar region: Secondary | ICD-10-CM | POA: Diagnosis not present

## 2024-06-19 DIAGNOSIS — H401111 Primary open-angle glaucoma, right eye, mild stage: Secondary | ICD-10-CM | POA: Diagnosis not present

## 2024-06-24 DIAGNOSIS — N401 Enlarged prostate with lower urinary tract symptoms: Secondary | ICD-10-CM | POA: Diagnosis not present

## 2024-06-24 DIAGNOSIS — R351 Nocturia: Secondary | ICD-10-CM | POA: Diagnosis not present

## 2024-06-30 DIAGNOSIS — M9902 Segmental and somatic dysfunction of thoracic region: Secondary | ICD-10-CM | POA: Diagnosis not present

## 2024-06-30 DIAGNOSIS — M9903 Segmental and somatic dysfunction of lumbar region: Secondary | ICD-10-CM | POA: Diagnosis not present

## 2024-07-14 DIAGNOSIS — N401 Enlarged prostate with lower urinary tract symptoms: Secondary | ICD-10-CM | POA: Diagnosis not present

## 2024-07-14 DIAGNOSIS — R3912 Poor urinary stream: Secondary | ICD-10-CM | POA: Diagnosis not present

## 2024-07-17 DIAGNOSIS — N401 Enlarged prostate with lower urinary tract symptoms: Secondary | ICD-10-CM | POA: Diagnosis not present

## 2024-07-17 DIAGNOSIS — R3912 Poor urinary stream: Secondary | ICD-10-CM | POA: Diagnosis not present
